# Patient Record
Sex: Female | Born: 1975 | Hispanic: No | Marital: Married | Smoking: Former smoker
Health system: Southern US, Community
[De-identification: ages and names within clinical notes are randomized; demographics above are authoritative.]

## PROBLEM LIST (undated history)

## (undated) ENCOUNTER — Inpatient Hospital Stay (HOSPITAL_COMMUNITY): Payer: Self-pay

## (undated) DIAGNOSIS — E669 Obesity, unspecified: Secondary | ICD-10-CM

## (undated) DIAGNOSIS — M722 Plantar fascial fibromatosis: Secondary | ICD-10-CM

## (undated) DIAGNOSIS — O24419 Gestational diabetes mellitus in pregnancy, unspecified control: Secondary | ICD-10-CM

## (undated) DIAGNOSIS — J45909 Unspecified asthma, uncomplicated: Secondary | ICD-10-CM

## (undated) DIAGNOSIS — I10 Essential (primary) hypertension: Secondary | ICD-10-CM

## (undated) DIAGNOSIS — E282 Polycystic ovarian syndrome: Secondary | ICD-10-CM

## (undated) DIAGNOSIS — D649 Anemia, unspecified: Secondary | ICD-10-CM

## (undated) DIAGNOSIS — E039 Hypothyroidism, unspecified: Secondary | ICD-10-CM

## (undated) DIAGNOSIS — E785 Hyperlipidemia, unspecified: Secondary | ICD-10-CM

## (undated) HISTORY — DX: Gestational diabetes mellitus in pregnancy, unspecified control: O24.419

## (undated) HISTORY — DX: Hyperlipidemia, unspecified: E78.5

## (undated) HISTORY — PX: OTHER SURGICAL HISTORY: SHX169

## (undated) HISTORY — DX: Obesity, unspecified: E66.9

## (undated) HISTORY — DX: Plantar fascial fibromatosis: M72.2

## (undated) HISTORY — PX: CERVICAL POLYPECTOMY: SHX88

## (undated) HISTORY — PX: PLANTAR FASCIA RELEASE: SHX2239

---

## 2008-02-20 ENCOUNTER — Inpatient Hospital Stay (HOSPITAL_COMMUNITY): Admission: AD | Admit: 2008-02-20 | Discharge: 2008-02-20 | Payer: Self-pay | Admitting: Obstetrics & Gynecology

## 2008-03-10 ENCOUNTER — Ambulatory Visit: Payer: Self-pay | Admitting: Internal Medicine

## 2008-03-10 ENCOUNTER — Encounter (INDEPENDENT_AMBULATORY_CARE_PROVIDER_SITE_OTHER): Payer: Self-pay | Admitting: Family Medicine

## 2008-03-10 LAB — CONVERTED CEMR LAB
ALT: 10 units/L (ref 0–35)
AST: 16 units/L (ref 0–37)
Albumin: 4.5 g/dL (ref 3.5–5.2)
Alkaline Phosphatase: 85 units/L (ref 39–117)
Anti Nuclear Antibody(ANA): NEGATIVE
BUN: 12 mg/dL (ref 6–23)
Basophils Absolute: 0 10*3/uL (ref 0.0–0.1)
Basophils Relative: 0 % (ref 0–1)
CO2: 24 meq/L (ref 19–32)
Calcium: 9.3 mg/dL (ref 8.4–10.5)
Chloride: 104 meq/L (ref 96–112)
Cholesterol: 212 mg/dL — ABNORMAL HIGH (ref 0–200)
Creatinine, Ser: 0.6 mg/dL (ref 0.40–1.20)
Eosinophils Absolute: 0.2 10*3/uL (ref 0.0–0.7)
Eosinophils Relative: 3 % (ref 0–5)
FSH: 3.2 milliintl units/mL
Glucose, Bld: 92 mg/dL (ref 70–99)
HCT: 41.8 % (ref 36.0–46.0)
HDL: 44 mg/dL (ref >39)
Hemoglobin: 13.7 g/dL (ref 12.0–15.0)
LDL Cholesterol: 134 mg/dL — ABNORMAL HIGH (ref 0–99)
Lymphocytes Relative: 35 % (ref 12–46)
Lymphs Abs: 2.9 10*3/uL (ref 0.7–4.0)
MCHC: 32.8 g/dL (ref 30.0–36.0)
MCV: 78.6 fL (ref 78.0–100.0)
Monocytes Absolute: 0.5 10*3/uL (ref 0.1–1.0)
Monocytes Relative: 6 % (ref 3–12)
Neutro Abs: 4.5 10*3/uL (ref 1.7–7.7)
Neutrophils Relative %: 55 % (ref 43–77)
Platelets: 254 10*3/uL (ref 150–400)
Potassium: 4.1 meq/L (ref 3.5–5.3)
RBC: 5.32 M/uL — ABNORMAL HIGH (ref 3.87–5.11)
RDW: 12.7 % (ref 11.5–15.5)
Sed Rate: 14 mm/hr (ref 0–22)
Sodium: 139 meq/L (ref 135–145)
TSH: 2.72 microintl units/mL (ref 0.350–5.50)
Testosterone: 59.05 ng/dL (ref 10–70)
Total Bilirubin: 0.5 mg/dL (ref 0.3–1.2)
Total CHOL/HDL Ratio: 4.8
Total Protein: 7.9 g/dL (ref 6.0–8.3)
Triglycerides: 170 mg/dL — ABNORMAL HIGH (ref <150)
VLDL: 34 mg/dL (ref 0–40)
WBC: 8.2 10*3/uL (ref 4.0–10.5)

## 2008-03-24 ENCOUNTER — Ambulatory Visit: Payer: Self-pay | Admitting: *Deleted

## 2008-03-24 ENCOUNTER — Ambulatory Visit: Payer: Self-pay | Admitting: Family Medicine

## 2008-03-25 ENCOUNTER — Encounter (INDEPENDENT_AMBULATORY_CARE_PROVIDER_SITE_OTHER): Payer: Self-pay | Admitting: Family Medicine

## 2008-04-07 ENCOUNTER — Ambulatory Visit: Payer: Self-pay | Admitting: Internal Medicine

## 2008-04-07 ENCOUNTER — Encounter (INDEPENDENT_AMBULATORY_CARE_PROVIDER_SITE_OTHER): Payer: Self-pay | Admitting: Family Medicine

## 2008-04-07 LAB — CONVERTED CEMR LAB
Basophils Absolute: 0 10*3/uL (ref 0.0–0.1)
Basophils Relative: 0 % (ref 0–1)
Eosinophils Absolute: 0.3 10*3/uL (ref 0.0–0.7)
Eosinophils Relative: 4 % (ref 0–5)
HCT: 41.2 % (ref 36.0–46.0)
Hemoglobin: 13.2 g/dL (ref 12.0–15.0)
Lymphocytes Relative: 34 % (ref 12–46)
Lymphs Abs: 2.3 10*3/uL (ref 0.7–4.0)
MCHC: 32 g/dL (ref 30.0–36.0)
MCV: 79.8 fL (ref 78.0–100.0)
Monocytes Absolute: 0.5 10*3/uL (ref 0.1–1.0)
Monocytes Relative: 7 % (ref 3–12)
Neutro Abs: 3.6 10*3/uL (ref 1.7–7.7)
Neutrophils Relative %: 54 % (ref 43–77)
Platelets: 266 10*3/uL (ref 150–400)
RBC: 5.16 M/uL — ABNORMAL HIGH (ref 3.87–5.11)
RDW: 13 % (ref 11.5–15.5)
WBC: 6.7 10*3/uL (ref 4.0–10.5)

## 2008-04-18 ENCOUNTER — Ambulatory Visit: Payer: Self-pay | Admitting: Internal Medicine

## 2008-04-22 ENCOUNTER — Ambulatory Visit (HOSPITAL_COMMUNITY): Admission: RE | Admit: 2008-04-22 | Discharge: 2008-04-22 | Payer: Self-pay | Admitting: Internal Medicine

## 2008-04-30 ENCOUNTER — Ambulatory Visit: Payer: Self-pay | Admitting: Internal Medicine

## 2008-05-20 ENCOUNTER — Ambulatory Visit: Payer: Self-pay | Admitting: Internal Medicine

## 2008-05-20 ENCOUNTER — Encounter (INDEPENDENT_AMBULATORY_CARE_PROVIDER_SITE_OTHER): Payer: Self-pay | Admitting: Family Medicine

## 2008-05-20 LAB — CONVERTED CEMR LAB
ALT: 11 units/L (ref 0–35)
AST: 14 units/L (ref 0–37)
Albumin: 4.1 g/dL (ref 3.5–5.2)
Alkaline Phosphatase: 83 units/L (ref 39–117)
BUN: 16 mg/dL (ref 6–23)
CO2: 25 meq/L (ref 19–32)
Calcium: 8.9 mg/dL (ref 8.4–10.5)
Chloride: 105 meq/L (ref 96–112)
Cholesterol: 201 mg/dL — ABNORMAL HIGH (ref 0–200)
Creatinine, Ser: 0.63 mg/dL (ref 0.40–1.20)
Glucose, Bld: 147 mg/dL — ABNORMAL HIGH (ref 70–99)
HDL: 39 mg/dL — ABNORMAL LOW (ref >39)
LDL Cholesterol: 134 mg/dL — ABNORMAL HIGH (ref 0–99)
Potassium: 4.6 meq/L (ref 3.5–5.3)
Sodium: 140 meq/L (ref 135–145)
TSH: 5.113 microintl units/mL — ABNORMAL HIGH (ref 0.350–4.50)
Total Bilirubin: 0.4 mg/dL (ref 0.3–1.2)
Total CHOL/HDL Ratio: 5.2
Total Protein: 7.1 g/dL (ref 6.0–8.3)
Triglycerides: 138 mg/dL (ref <150)
VLDL: 28 mg/dL (ref 0–40)

## 2008-05-21 ENCOUNTER — Encounter (INDEPENDENT_AMBULATORY_CARE_PROVIDER_SITE_OTHER): Payer: Self-pay | Admitting: Family Medicine

## 2008-06-04 ENCOUNTER — Ambulatory Visit: Payer: Self-pay | Admitting: Family Medicine

## 2008-06-04 LAB — CONVERTED CEMR LAB
T3, Total: 164.8 ng/dL (ref 80.0–204.0)
T4, Total: 9.2 ug/dL (ref 5.0–12.5)
TSH: 5.753 microintl units/mL — ABNORMAL HIGH (ref 0.350–4.50)

## 2008-06-09 ENCOUNTER — Ambulatory Visit: Payer: Self-pay | Admitting: Internal Medicine

## 2008-06-10 ENCOUNTER — Encounter (INDEPENDENT_AMBULATORY_CARE_PROVIDER_SITE_OTHER): Payer: Self-pay | Admitting: Family Medicine

## 2008-06-17 ENCOUNTER — Ambulatory Visit: Payer: Self-pay | Admitting: Internal Medicine

## 2008-07-03 ENCOUNTER — Ambulatory Visit: Payer: Self-pay | Admitting: Internal Medicine

## 2008-07-15 ENCOUNTER — Ambulatory Visit: Payer: Self-pay | Admitting: Internal Medicine

## 2008-07-15 ENCOUNTER — Encounter (INDEPENDENT_AMBULATORY_CARE_PROVIDER_SITE_OTHER): Payer: Self-pay | Admitting: Adult Health

## 2008-07-15 LAB — CONVERTED CEMR LAB
Basophils Absolute: 0 10*3/uL (ref 0.0–0.1)
Basophils Relative: 0 % (ref 0–1)
Eosinophils Absolute: 0.2 10*3/uL (ref 0.0–0.7)
Eosinophils Relative: 3 % (ref 0–5)
HCT: 37.1 % (ref 36.0–46.0)
Hemoglobin: 12.5 g/dL (ref 12.0–15.0)
Lymphocytes Relative: 28 % (ref 12–46)
Lymphs Abs: 2.4 10*3/uL (ref 0.7–4.0)
MCHC: 33.7 g/dL (ref 30.0–36.0)
MCV: 78.4 fL (ref 78.0–100.0)
Monocytes Absolute: 0.6 10*3/uL (ref 0.1–1.0)
Monocytes Relative: 7 % (ref 3–12)
Neutro Abs: 5.5 10*3/uL (ref 1.7–7.7)
Neutrophils Relative %: 63 % (ref 43–77)
Platelets: 245 10*3/uL (ref 150–400)
RBC: 4.73 M/uL (ref 3.87–5.11)
RDW: 12.5 % (ref 11.5–15.5)
T4, Total: 9.3 ug/dL (ref 5.0–12.5)
TSH: 2.77 microintl units/mL (ref 0.350–4.50)
WBC: 8.7 10*3/uL (ref 4.0–10.5)

## 2008-07-18 ENCOUNTER — Ambulatory Visit: Payer: Self-pay | Admitting: Internal Medicine

## 2008-07-18 ENCOUNTER — Encounter (INDEPENDENT_AMBULATORY_CARE_PROVIDER_SITE_OTHER): Payer: Self-pay | Admitting: Adult Health

## 2008-07-18 LAB — CONVERTED CEMR LAB
ALT: 13 units/L (ref 0–35)
AST: 15 units/L (ref 0–37)
Albumin: 4.4 g/dL (ref 3.5–5.2)
Alkaline Phosphatase: 94 units/L (ref 39–117)
BUN: 14 mg/dL (ref 6–23)
CEA: 0.9 ng/mL (ref 0.0–5.0)
CO2: 27 meq/L (ref 19–32)
Calcium: 9.1 mg/dL (ref 8.4–10.5)
Chloride: 102 meq/L (ref 96–112)
Cholesterol: 202 mg/dL — ABNORMAL HIGH (ref 0–200)
Creatinine, Ser: 0.64 mg/dL (ref 0.40–1.20)
Glucose, Bld: 103 mg/dL — ABNORMAL HIGH (ref 70–99)
HDL: 40 mg/dL (ref >39)
LDL Cholesterol: 129 mg/dL — ABNORMAL HIGH (ref 0–99)
Microalb, Ur: 0.55 mg/dL (ref 0.00–1.89)
Potassium: 4.3 meq/L (ref 3.5–5.3)
Sodium: 141 meq/L (ref 135–145)
Total Bilirubin: 0.4 mg/dL (ref 0.3–1.2)
Total CHOL/HDL Ratio: 5.1
Total Protein: 7.5 g/dL (ref 6.0–8.3)
Triglycerides: 164 mg/dL — ABNORMAL HIGH (ref <150)
VLDL: 33 mg/dL (ref 0–40)
Vit D, 1,25-Dihydroxy: 16 — ABNORMAL LOW (ref 30–89)

## 2008-07-19 ENCOUNTER — Encounter (INDEPENDENT_AMBULATORY_CARE_PROVIDER_SITE_OTHER): Payer: Self-pay | Admitting: Adult Health

## 2008-09-16 ENCOUNTER — Ambulatory Visit: Payer: Self-pay | Admitting: Internal Medicine

## 2008-09-29 ENCOUNTER — Ambulatory Visit: Payer: Self-pay | Admitting: Internal Medicine

## 2008-09-29 ENCOUNTER — Encounter (INDEPENDENT_AMBULATORY_CARE_PROVIDER_SITE_OTHER): Payer: Self-pay | Admitting: Adult Health

## 2008-09-29 LAB — CONVERTED CEMR LAB
ALT: 12 units/L (ref 0–35)
AST: 12 units/L (ref 0–37)
Albumin: 4.4 g/dL (ref 3.5–5.2)
Alkaline Phosphatase: 78 units/L (ref 39–117)
BUN: 14 mg/dL (ref 6–23)
Basophils Absolute: 0 10*3/uL (ref 0.0–0.1)
Basophils Relative: 0 % (ref 0–1)
CO2: 20 meq/L (ref 19–32)
Calcium: 8.7 mg/dL (ref 8.4–10.5)
Chloride: 104 meq/L (ref 96–112)
Cholesterol: 199 mg/dL (ref 0–200)
Creatinine, Ser: 0.59 mg/dL (ref 0.40–1.20)
Eosinophils Absolute: 0.1 10*3/uL (ref 0.0–0.7)
Eosinophils Relative: 1 % (ref 0–5)
Free T4: 0.9 ng/dL (ref 0.89–1.80)
Glucose, Bld: 83 mg/dL (ref 70–99)
HCT: 38.8 % (ref 36.0–46.0)
HDL: 43 mg/dL (ref >39)
Hemoglobin: 13 g/dL (ref 12.0–15.0)
LDL Cholesterol: 135 mg/dL — ABNORMAL HIGH (ref 0–99)
Lymphocytes Relative: 28 % (ref 12–46)
Lymphs Abs: 2.6 10*3/uL (ref 0.7–4.0)
MCHC: 33.5 g/dL (ref 30.0–36.0)
MCV: 75.8 fL — ABNORMAL LOW (ref 78.0–100.0)
Monocytes Absolute: 0.5 10*3/uL (ref 0.1–1.0)
Monocytes Relative: 5 % (ref 3–12)
Neutro Abs: 6.1 10*3/uL (ref 1.7–7.7)
Neutrophils Relative %: 66 % (ref 43–77)
Platelets: 231 10*3/uL (ref 150–400)
Potassium: 3.7 meq/L (ref 3.5–5.3)
RBC: 5.12 M/uL — ABNORMAL HIGH (ref 3.87–5.11)
RDW: 12.5 % (ref 11.5–15.5)
Sodium: 140 meq/L (ref 135–145)
T3 Uptake Ratio: 29.8 % (ref 22.5–37.0)
T4, Total: 8.8 ug/dL (ref 5.0–12.5)
TSH: 2.579 microintl units/mL (ref 0.350–4.50)
Total Bilirubin: 0.5 mg/dL (ref 0.3–1.2)
Total CHOL/HDL Ratio: 4.6
Total Protein: 7.5 g/dL (ref 6.0–8.3)
Triglycerides: 107 mg/dL (ref <150)
VLDL: 21 mg/dL (ref 0–40)
WBC: 9.2 10*3/uL (ref 4.0–10.5)

## 2008-12-29 ENCOUNTER — Ambulatory Visit: Payer: Self-pay | Admitting: Internal Medicine

## 2008-12-29 ENCOUNTER — Encounter (INDEPENDENT_AMBULATORY_CARE_PROVIDER_SITE_OTHER): Payer: Self-pay | Admitting: Adult Health

## 2008-12-29 LAB — CONVERTED CEMR LAB
ALT: 12 units/L (ref 0–35)
AST: 14 units/L (ref 0–37)
Albumin: 4.4 g/dL (ref 3.5–5.2)
Alkaline Phosphatase: 82 units/L (ref 39–117)
BUN: 9 mg/dL (ref 6–23)
Basophils Absolute: 0 10*3/uL (ref 0.0–0.1)
Basophils Relative: 0 % (ref 0–1)
CO2: 24 meq/L (ref 19–32)
Calcium: 9.1 mg/dL (ref 8.4–10.5)
Chloride: 104 meq/L (ref 96–112)
Cholesterol: 217 mg/dL — ABNORMAL HIGH (ref 0–200)
Creatinine, Ser: 0.65 mg/dL (ref 0.40–1.20)
Eosinophils Absolute: 0.4 10*3/uL (ref 0.0–0.7)
Eosinophils Relative: 6 % — ABNORMAL HIGH (ref 0–5)
Free T4: 0.87 ng/dL — ABNORMAL LOW (ref 0.89–1.80)
Glucose, Bld: 96 mg/dL (ref 70–99)
HCT: 39.9 % (ref 36.0–46.0)
HDL: 36 mg/dL — ABNORMAL LOW (ref >39)
Hemoglobin: 13.4 g/dL (ref 12.0–15.0)
LDL Cholesterol: 158 mg/dL — ABNORMAL HIGH (ref 0–99)
Lymphocytes Relative: 31 % (ref 12–46)
Lymphs Abs: 1.9 10*3/uL (ref 0.7–4.0)
MCHC: 33.6 g/dL (ref 30.0–36.0)
MCV: 78.7 fL (ref 78.0–100.0)
Monocytes Absolute: 0.5 10*3/uL (ref 0.1–1.0)
Monocytes Relative: 9 % (ref 3–12)
Neutro Abs: 3.3 10*3/uL (ref 1.7–7.7)
Neutrophils Relative %: 54 % (ref 43–77)
Platelets: 257 10*3/uL (ref 150–400)
Potassium: 4.2 meq/L (ref 3.5–5.3)
RBC: 5.07 M/uL (ref 3.87–5.11)
RDW: 13.2 % (ref 11.5–15.5)
Sodium: 141 meq/L (ref 135–145)
TSH: 3.229 microintl units/mL (ref 0.350–4.500)
Total Bilirubin: 0.7 mg/dL (ref 0.3–1.2)
Total CHOL/HDL Ratio: 6
Total Protein: 7.3 g/dL (ref 6.0–8.3)
Triglycerides: 116 mg/dL (ref <150)
VLDL: 23 mg/dL (ref 0–40)
WBC: 6.2 10*3/uL (ref 4.0–10.5)

## 2009-01-15 ENCOUNTER — Ambulatory Visit: Payer: Self-pay | Admitting: Internal Medicine

## 2009-02-03 ENCOUNTER — Emergency Department (HOSPITAL_COMMUNITY): Admission: EM | Admit: 2009-02-03 | Discharge: 2009-02-03 | Payer: Self-pay | Admitting: Emergency Medicine

## 2009-03-12 ENCOUNTER — Ambulatory Visit: Payer: Self-pay | Admitting: Family Medicine

## 2009-03-19 ENCOUNTER — Encounter: Admission: RE | Admit: 2009-03-19 | Discharge: 2009-03-19 | Payer: Self-pay | Admitting: Adult Health

## 2009-06-25 ENCOUNTER — Ambulatory Visit: Payer: Self-pay | Admitting: Family Medicine

## 2009-06-25 ENCOUNTER — Encounter (INDEPENDENT_AMBULATORY_CARE_PROVIDER_SITE_OTHER): Payer: Self-pay | Admitting: Adult Health

## 2009-06-25 LAB — CONVERTED CEMR LAB
Basophils Absolute: 0 10*3/uL (ref 0.0–0.1)
Basophils Relative: 0 % (ref 0–1)
Chlamydia, DNA Probe: NEGATIVE
Eosinophils Absolute: 0.3 10*3/uL (ref 0.0–0.7)
Eosinophils Relative: 3 % (ref 0–5)
FSH: 5.1 milliintl units/mL
GC Probe Amp, Genital: NEGATIVE
HCT: 38.8 % (ref 36.0–46.0)
Helicobacter Pylori Antibody-IgG: 4.3 — ABNORMAL HIGH
Hemoglobin: 12.7 g/dL (ref 12.0–15.0)
LH: 26.4 milliintl units/mL
Lymphocytes Relative: 29 % (ref 12–46)
Lymphs Abs: 2.8 10*3/uL (ref 0.7–4.0)
MCHC: 32.7 g/dL (ref 30.0–36.0)
MCV: 77.9 fL — ABNORMAL LOW (ref 78.0–100.0)
Monocytes Absolute: 0.6 10*3/uL (ref 0.1–1.0)
Monocytes Relative: 6 % (ref 3–12)
Neutro Abs: 6.1 10*3/uL (ref 1.7–7.7)
Neutrophils Relative %: 62 % (ref 43–77)
Platelets: 254 10*3/uL (ref 150–400)
RBC: 4.98 M/uL (ref 3.87–5.11)
RDW: 12.6 % (ref 11.5–15.5)
T3 Uptake Ratio: 34.1 % (ref 22.5–37.0)
TSH: 3.426 microintl units/mL (ref 0.350–4.500)
Testosterone: 90.82 ng/dL — ABNORMAL HIGH (ref 10–70)
WBC: 9.9 10*3/uL (ref 4.0–10.5)

## 2009-06-26 ENCOUNTER — Ambulatory Visit: Payer: Self-pay | Admitting: Internal Medicine

## 2009-06-29 ENCOUNTER — Encounter (INDEPENDENT_AMBULATORY_CARE_PROVIDER_SITE_OTHER): Payer: Self-pay | Admitting: Adult Health

## 2009-06-29 LAB — CONVERTED CEMR LAB: Hgb A1c MFr Bld: 5.7 % (ref 4.6–6.1)

## 2009-07-08 ENCOUNTER — Ambulatory Visit (HOSPITAL_COMMUNITY): Admission: RE | Admit: 2009-07-08 | Discharge: 2009-07-08 | Payer: Self-pay | Admitting: Internal Medicine

## 2009-07-23 ENCOUNTER — Ambulatory Visit: Payer: Self-pay | Admitting: Internal Medicine

## 2009-09-11 ENCOUNTER — Ambulatory Visit: Payer: Self-pay | Admitting: Internal Medicine

## 2009-09-11 ENCOUNTER — Encounter (INDEPENDENT_AMBULATORY_CARE_PROVIDER_SITE_OTHER): Payer: Self-pay | Admitting: Adult Health

## 2009-09-11 LAB — CONVERTED CEMR LAB
T3 Uptake Ratio: 30.4 % (ref 22.5–37.0)
TSH: 2.374 microintl units/mL (ref 0.350–4.500)
Vit D, 25-Hydroxy: 17 ng/mL — ABNORMAL LOW (ref 30–89)

## 2010-02-12 ENCOUNTER — Ambulatory Visit: Payer: Self-pay | Admitting: Family Medicine

## 2010-02-12 ENCOUNTER — Encounter (INDEPENDENT_AMBULATORY_CARE_PROVIDER_SITE_OTHER): Payer: Self-pay | Admitting: Adult Health

## 2010-02-12 LAB — CONVERTED CEMR LAB
ALT: 9 units/L (ref 0–35)
AST: 14 units/L (ref 0–37)
Albumin: 4.7 g/dL (ref 3.5–5.2)
Alkaline Phosphatase: 90 units/L (ref 39–117)
BUN: 16 mg/dL (ref 6–23)
Basophils Absolute: 0 10*3/uL (ref 0.0–0.1)
Basophils Relative: 0 % (ref 0–1)
CO2: 26 meq/L (ref 19–32)
Calcium: 9.3 mg/dL (ref 8.4–10.5)
Chloride: 103 meq/L (ref 96–112)
Cholesterol: 229 mg/dL — ABNORMAL HIGH (ref 0–200)
Creatinine, Ser: 0.74 mg/dL (ref 0.40–1.20)
Eosinophils Absolute: 0.4 10*3/uL (ref 0.0–0.7)
Eosinophils Relative: 5 % (ref 0–5)
Free T4: 1.05 ng/dL (ref 0.80–1.80)
Glucose, Bld: 101 mg/dL — ABNORMAL HIGH (ref 70–99)
HCT: 42.3 % (ref 36.0–46.0)
HDL: 42 mg/dL (ref >39)
Hemoglobin: 13.8 g/dL (ref 12.0–15.0)
LDL Cholesterol: 161 mg/dL — ABNORMAL HIGH (ref 0–99)
Lymphocytes Relative: 29 % (ref 12–46)
Lymphs Abs: 2.1 10*3/uL (ref 0.7–4.0)
MCHC: 32.6 g/dL (ref 30.0–36.0)
MCV: 78.6 fL (ref 78.0–100.0)
Monocytes Absolute: 0.5 10*3/uL (ref 0.1–1.0)
Monocytes Relative: 6 % (ref 3–12)
Neutro Abs: 4.2 10*3/uL (ref 1.7–7.7)
Neutrophils Relative %: 59 % (ref 43–77)
Platelets: 251 10*3/uL (ref 150–400)
Potassium: 4.1 meq/L (ref 3.5–5.3)
RBC: 5.38 M/uL — ABNORMAL HIGH (ref 3.87–5.11)
RDW: 13.1 % (ref 11.5–15.5)
Sed Rate: 16 mm/hr (ref 0–22)
Sodium: 140 meq/L (ref 135–145)
TSH: 5.225 microintl units/mL — ABNORMAL HIGH (ref 0.350–4.500)
Total Bilirubin: 0.4 mg/dL (ref 0.3–1.2)
Total CHOL/HDL Ratio: 5.5
Total Protein: 7.7 g/dL (ref 6.0–8.3)
Triglycerides: 130 mg/dL (ref <150)
Uric Acid, Serum: 5.9 mg/dL (ref 2.4–7.0)
VLDL: 26 mg/dL (ref 0–40)
Vit D, 25-Hydroxy: 24 ng/mL — ABNORMAL LOW (ref 30–89)
WBC: 7.1 10*3/uL (ref 4.0–10.5)

## 2010-02-26 ENCOUNTER — Ambulatory Visit: Payer: Self-pay | Admitting: Internal Medicine

## 2010-03-09 ENCOUNTER — Ambulatory Visit: Payer: Self-pay | Admitting: Internal Medicine

## 2010-03-16 ENCOUNTER — Ambulatory Visit: Payer: Self-pay | Admitting: Internal Medicine

## 2010-04-14 ENCOUNTER — Ambulatory Visit: Payer: Self-pay | Admitting: Internal Medicine

## 2010-04-14 ENCOUNTER — Ambulatory Visit (HOSPITAL_COMMUNITY): Admission: RE | Admit: 2010-04-14 | Discharge: 2010-04-14 | Payer: Self-pay | Admitting: Podiatry

## 2010-04-30 ENCOUNTER — Ambulatory Visit: Payer: Self-pay | Admitting: Internal Medicine

## 2010-04-30 LAB — CONVERTED CEMR LAB
ALT: 12 units/L (ref 0–35)
AST: 13 units/L (ref 0–37)
Albumin: 4.4 g/dL (ref 3.5–5.2)
Alkaline Phosphatase: 75 units/L (ref 39–117)
Anti Nuclear Antibody(ANA): NEGATIVE
BUN: 12 mg/dL (ref 6–23)
Basophils Absolute: 0 10*3/uL (ref 0.0–0.1)
Basophils Relative: 0 % (ref 0–1)
CO2: 24 meq/L (ref 19–32)
CRP: 2 mg/dL — ABNORMAL HIGH (ref <0.6)
Calcium: 8.8 mg/dL (ref 8.4–10.5)
Chloride: 105 meq/L (ref 96–112)
Creatinine, Ser: 0.66 mg/dL (ref 0.40–1.20)
Eosinophils Absolute: 0.3 10*3/uL (ref 0.0–0.7)
Eosinophils Relative: 4 % (ref 0–5)
Glucose, Bld: 96 mg/dL (ref 70–99)
HCT: 38.7 % (ref 36.0–46.0)
Hemoglobin: 12.8 g/dL (ref 12.0–15.0)
Lymphocytes Relative: 30 % (ref 12–46)
Lymphs Abs: 2.2 10*3/uL (ref 0.7–4.0)
MCHC: 33.1 g/dL (ref 30.0–36.0)
MCV: 76.9 fL — ABNORMAL LOW (ref 78.0–100.0)
Monocytes Absolute: 0.4 10*3/uL (ref 0.1–1.0)
Monocytes Relative: 5 % (ref 3–12)
Neutro Abs: 4.5 10*3/uL (ref 1.7–7.7)
Neutrophils Relative %: 61 % (ref 43–77)
Platelets: 242 10*3/uL (ref 150–400)
Potassium: 4 meq/L (ref 3.5–5.3)
RBC: 5.03 M/uL (ref 3.87–5.11)
RDW: 12.9 % (ref 11.5–15.5)
Rheumatoid fact SerPl-aCnc: <20 intl units/mL (ref 0–20)
Sed Rate: 13 mm/hr (ref 0–22)
Sodium: 138 meq/L (ref 135–145)
T4, Total: 10.6 ug/dL (ref 5.0–12.5)
TSH: 2.922 microintl units/mL (ref 0.350–4.500)
Total Bilirubin: 0.5 mg/dL (ref 0.3–1.2)
Total Protein: 7.1 g/dL (ref 6.0–8.3)
Uric Acid, Serum: 5.1 mg/dL (ref 2.4–7.0)
WBC: 7.4 10*3/uL (ref 4.0–10.5)

## 2010-05-17 ENCOUNTER — Telehealth (INDEPENDENT_AMBULATORY_CARE_PROVIDER_SITE_OTHER): Payer: Self-pay | Admitting: *Deleted

## 2010-05-21 ENCOUNTER — Ambulatory Visit: Payer: Self-pay | Admitting: Family Medicine

## 2010-07-08 ENCOUNTER — Ambulatory Visit: Payer: Self-pay | Admitting: Obstetrics & Gynecology

## 2010-07-08 LAB — CONVERTED CEMR LAB
Chlamydia, DNA Probe: NEGATIVE
GC Probe Amp, Genital: NEGATIVE
Prolactin: 18.1 ng/mL
TSH: 3.051 microintl units/mL (ref 0.350–4.500)
Testosterone: 40.27 ng/dL (ref 10–70)
hCG, Beta Chain, Quant, S: <2 milliintl units/mL

## 2010-07-14 ENCOUNTER — Ambulatory Visit (HOSPITAL_COMMUNITY): Admission: RE | Admit: 2010-07-14 | Discharge: 2010-07-14 | Payer: Self-pay | Admitting: Family Medicine

## 2010-07-29 ENCOUNTER — Ambulatory Visit: Payer: Self-pay | Admitting: Obstetrics & Gynecology

## 2010-07-29 ENCOUNTER — Encounter (INDEPENDENT_AMBULATORY_CARE_PROVIDER_SITE_OTHER): Payer: Self-pay | Admitting: *Deleted

## 2010-07-29 LAB — CONVERTED CEMR LAB: Hgb A1c MFr Bld: 5.4 % (ref <5.7)

## 2010-10-19 ENCOUNTER — Encounter (INDEPENDENT_AMBULATORY_CARE_PROVIDER_SITE_OTHER): Payer: Self-pay | Admitting: Internal Medicine

## 2010-10-19 LAB — CONVERTED CEMR LAB
BUN: 12 mg/dL (ref 6–23)
CO2: 25 meq/L (ref 19–32)
Creatinine, Ser: 0.62 mg/dL (ref 0.40–1.20)
FSH: 3.7 milliintl units/mL
Glucose, Bld: 87 mg/dL (ref 70–99)
Helicobacter Pylori Antibody-IgG: 0.62
LH: 6.8 milliintl units/mL
TSH: 5.068 microintl units/mL — ABNORMAL HIGH (ref 0.350–4.500)
Total Bilirubin: 0.3 mg/dL (ref 0.3–1.2)

## 2010-10-26 NOTE — Progress Notes (Signed)
Summary: triage/vaginal bleeding  Phone Note Call from Patient   Caller: Patient Reason for Call: Talk to Nurse Summary of Call: Patient had wanted a referral for Gyn. She had been trying to get pregnant and was seeing Cy Blamer NP.  She had her regular period August 5th and is now bleeding again today.  She describes this bleeding  as light and is not having any cramping or pain. Advised patient that we do not have any appointments today and that she could possibly be pregnant or just having an irregular period.  Should her bleeding become heavy or experience cramping or pain she may want to have someone [facility].  Patient to monitor bleeding and call me if not changes later in the week . Patient to schedule appointment with new provider in our office for follow up care. Initial call taken by: Conchita Paris,  May 17, 2010 2:43 PM

## 2010-11-15 ENCOUNTER — Ambulatory Visit: Payer: Self-pay | Admitting: Physician Assistant

## 2010-11-18 ENCOUNTER — Ambulatory Visit: Payer: PRIVATE HEALTH INSURANCE | Admitting: Physician Assistant

## 2010-11-18 DIAGNOSIS — N979 Female infertility, unspecified: Secondary | ICD-10-CM

## 2010-11-26 ENCOUNTER — Ambulatory Visit: Payer: Self-pay | Admitting: Obstetrics & Gynecology

## 2010-12-09 LAB — POCT PREGNANCY, URINE: Preg Test, Ur: NEGATIVE

## 2010-12-14 ENCOUNTER — Encounter: Payer: Self-pay | Admitting: Internal Medicine

## 2010-12-17 NOTE — Progress Notes (Signed)
[name], [name]              ACCOUNT NO.:  [id number]  MEDICAL RECORD NO.:  [id number]           PATIENT TYPE:  A  LOCATION:  [facility]                   FACILITY:  [facility]  PHYSICIAN:  Maylon Cos, CNM    DATE OF BIRTH:  1976-03-18  DATE OF SERVICE:  11/18/2010                                 CLINIC NOTE  The patient is being seen in GYN Clinic, [facility].  REASON FOR TODAY'S VISIT:  Follow up infertility and PCOS.  HISTORY OF PRESENT ILLNESS:  The patient is a 35 year old [place] female who is nulligravida.  She has been treated for PCOS and primary infertility in our Infertility Clinic.  For the last 3 months, she has been taking Clomid 25 mg on days 5 five through 9 of her menstrual cycles and using ovulation predictor kits to assess ovulation.  The patient reports that in the last 3 months that she has not ovulated on her predictor kit.  She is also a patient of [facility], she did discuss this with them and they did run a lab work that in addition to her routine TSH screening did show that her midcycle FSH was low and that she is likely not surging.  Her TSH was elevated at 5.068, and they did adjust her levothyroxine at that time to 50 mcg p.o. daily with plans to follow up in their office in 6-8 weeks.  She returns today for infertility treatment.  I have discussed with this patient that again I agree that she is not having a midcycle surge as expected which is also being seen on the ovulation predictor kit, and I recommend that we indeed increase her dosage of Clomid to 50 mg with her next cycle.  She is instructed to use 50 mg of Clomid p.o. on days 5 through 9 and also use her ovulation predictor kit daily to assess for ovulation.  If she achieves ovulation at this level, then we will continue her on 50 mg of Clomid daily until she hopefully achieves pregnancy.  If after this month of increasing her dose to 50 mg, she does not achieve ovulation at the next  cycle, she should increase her dosage to 100 mg and do her ovulation predictor kit.  I will keep her on 100 mg on days 5 through 9 for two cycles, and she should return to me in approximately 3 months if she has not achieved pregnancy.  The patient has multiple questions about the process from here.  She has undergone lab work and semen analysis with Dr. Elesa Hacker.  Unfortunately, she was unable to complete full treatment with him secondary to cost.  She does note that his evaluation of her husband semen analysis did show some motility problems but otherwise, normal count.  She is not currently financially able to afford HSG for tube patency.  We have also discussed PCOS and hypothyroidism as a lot of possible contributing factors as well.  I have strongly encouraged the patient to restart her metformin; however, she states that she is intolerant of the metformin due to hypoglycemic events, and she declines to restart at this time.  Of note, we  did also discuss that the patient has gained approximately 20 pounds since her last visit in November, and she is strongly encouraged weight loss and again discussion of metformin could aid in this as well.  IMPRESSION: 1. Primary infertility. 2. Polycystic ovarian syndrome. 3. History of irregular periods.  PLAN: 1. As above increase Clomid dose to 50 mg x1 cycle, if no ovulation     increase to 100 mg for two cycles.  The patient should return in 3     months at the next Infertility Clinic for further evaluation and     possible medication increase dose. 2. The patient has also been given information per her request for     Reproductive Endocrinology.  She has been given a referral     information to [facility] and also     [facility]  The patient should follow up in clinic in approximately 3     months or p.r.n. problems.          ______________________________ Maylon Cos, CNM    SS/MEDQ  D:  11/18/2010  T:   11/19/2010  Job:  [id number]

## 2011-02-03 ENCOUNTER — Encounter: Payer: Self-pay | Attending: Family Medicine | Admitting: *Deleted

## 2011-02-03 DIAGNOSIS — E785 Hyperlipidemia, unspecified: Secondary | ICD-10-CM | POA: Insufficient documentation

## 2011-02-03 DIAGNOSIS — Z713 Dietary counseling and surveillance: Secondary | ICD-10-CM | POA: Insufficient documentation

## 2011-02-25 ENCOUNTER — Ambulatory Visit: Payer: PRIVATE HEALTH INSURANCE | Admitting: Obstetrics and Gynecology

## 2011-03-14 ENCOUNTER — Ambulatory Visit: Payer: PRIVATE HEALTH INSURANCE | Admitting: *Deleted

## 2011-04-27 HISTORY — PX: PLANTAR FASCIA SURGERY: SHX746

## 2011-05-28 HISTORY — PX: OTHER SURGICAL HISTORY: SHX169

## 2011-05-28 HISTORY — PX: PLANTAR FASCIA SURGERY: SHX746

## 2011-06-22 LAB — URINALYSIS, ROUTINE W REFLEX MICROSCOPIC
Bilirubin Urine: NEGATIVE
Glucose, UA: NEGATIVE
Ketones, ur: NEGATIVE
Leukocytes, UA: NEGATIVE
Nitrite: NEGATIVE
Protein, ur: NEGATIVE
Specific Gravity, Urine: 1.025
Urobilinogen, UA: 0.2
pH: 5

## 2011-06-22 LAB — POCT PREGNANCY, URINE
Operator id: 29278
Preg Test, Ur: NEGATIVE

## 2011-06-22 LAB — URINE MICROSCOPIC-ADD ON

## 2012-05-22 ENCOUNTER — Encounter (HOSPITAL_COMMUNITY): Payer: Self-pay | Admitting: *Deleted

## 2012-05-23 ENCOUNTER — Encounter (HOSPITAL_COMMUNITY): Payer: Self-pay | Admitting: Pharmacist

## 2012-05-25 ENCOUNTER — Encounter (HOSPITAL_COMMUNITY)
Admission: RE | Admit: 2012-05-25 | Discharge: 2012-05-25 | Disposition: A | Discharge status: Home / Self Care | Payer: 59 | Source: Ambulatory Visit | Attending: Obstetrics and Gynecology | Admitting: Obstetrics and Gynecology

## 2012-05-25 ENCOUNTER — Encounter (HOSPITAL_COMMUNITY): Payer: Self-pay

## 2012-05-25 HISTORY — DX: Polycystic ovarian syndrome: E28.2

## 2012-05-25 HISTORY — DX: Unspecified asthma, uncomplicated: J45.909

## 2012-05-25 HISTORY — DX: Hypothyroidism, unspecified: E03.9

## 2012-05-25 HISTORY — DX: Essential (primary) hypertension: I10

## 2012-05-25 LAB — CBC
MCH: 25.1 pg — ABNORMAL LOW (ref 26.0–34.0)
MCV: 77.4 fL — ABNORMAL LOW (ref 78.0–100.0)
Platelets: 249 10*3/uL (ref 150–400)
RDW: 12.7 % (ref 11.5–15.5)
WBC: 10.5 10*3/uL (ref 4.0–10.5)

## 2012-05-25 NOTE — Patient Instructions (Addendum)
        Your procedure is scheduled on:06/01/12  Enter through the Main Entrance at :0600 am Pick up desk phone and dial [phone] and inform us of your arrival.  Please call [phone] if you have any problems the morning of surgery.  Remember: Do not eat after midnight:Thursday Do not drink after:midnight Thursday  Take these meds the morning of surgery with a sip of water: thyroid pills HOLD METFORMIN FOR 24 HOURS PRIOR TO SURGERY  DO NOT wear jewelry, eye make-up, lipstick,body lotion, or dark fingernail polish. Do not shave for 48 hours prior to surgery.   Patients discharged on the day of surgery will not be allowed to drive home.   Remember to use your Hibiclens as instructed.

## 2012-05-30 NOTE — H&P (Signed)
NAMESEVILLA, MURTAGH              ACCOUNT NO.:  [id number]  MEDICAL RECORD NO.:  [id number]  LOCATION:  [facility]                         [facility]ILITY:  [facility]  PHYSICIAN:  Zelphia Cairo, MD    DATE OF BIRTH:  June 30, 1976  DATE OF ADMISSION:  04/11/2012 DATE OF DISCHARGE:  05/25/2012                             HISTORY & PHYSICAL   HISTORY OF PRESENT ILLNESS:  A 37 year old, G0 presents today for hysteroscopy D and C, and removal of endometrial polyp.  Endometrial polyp was diagnosed during infertility workup.  PAST MEDICAL HISTORY:  Hypothyroidism, arthritis and diabetes.  MEDICATIONS:  Armour Thyroid and Glumetza.  SURGICAL HISTORY:  Bilateral foot surgery (plantar fasciitis.)  ALLERGIES:  None.  SOCIAL HISTORY:  Negative for tobacco, alcohol, and drug use.  FAMILY HISTORY:  Significant for heart disease.  PHYSICAL EXAMINATION:  VITAL SIGNS:  She is afebrile with stable vital signs.  Weight is 253 pounds. GENERAL:  She is in no acute distress. HEART:  Regular rate and rhythm. LUNGS:  Clear bilaterally. ABDOMEN:  Soft, nontender, nondistended.  No rebound or guarding.  IMAGING:  Pelvic ultrasound shows a 12-mm density in the endometrial cavity.  Bilateral ovaries appear normal.  ASSESSMENT:  A 36 year old with endometrial polyp and infertility.  PLAN:  For hysteroscopy and resection of endometrial polyp.     Zelphia Cairo, MD     GA/MEDQ  D:  05/29/2012  T:  05/30/2012  Job:  [id number]

## 2012-05-31 MED ORDER — DEXTROSE 5 % IV SOLN
2.0000 g | INTRAVENOUS | Status: AC
  Administered 2012-06-01: 2 g via INTRAVENOUS
  Filled 2012-05-31: qty 2

## 2012-06-01 ENCOUNTER — Ambulatory Visit (HOSPITAL_COMMUNITY): Payer: 59 | Admitting: Anesthesiology

## 2012-06-01 ENCOUNTER — Encounter (HOSPITAL_COMMUNITY): Payer: Self-pay | Admitting: Anesthesiology

## 2012-06-01 ENCOUNTER — Ambulatory Visit (HOSPITAL_COMMUNITY)
Admission: RE | Admit: 2012-06-01 | Discharge: 2012-06-01 | Disposition: A | Discharge status: Home / Self Care | Payer: 59 | Source: Ambulatory Visit | Attending: Obstetrics and Gynecology | Admitting: Obstetrics and Gynecology

## 2012-06-01 ENCOUNTER — Encounter (HOSPITAL_COMMUNITY): Payer: Self-pay | Admitting: *Deleted

## 2012-06-01 ENCOUNTER — Encounter (HOSPITAL_COMMUNITY)
Admission: RE | Disposition: A | Discharge status: Home / Self Care | Payer: Self-pay | Source: Ambulatory Visit | Attending: Obstetrics and Gynecology

## 2012-06-01 DIAGNOSIS — E039 Hypothyroidism, unspecified: Secondary | ICD-10-CM | POA: Insufficient documentation

## 2012-06-01 DIAGNOSIS — N979 Female infertility, unspecified: Secondary | ICD-10-CM | POA: Insufficient documentation

## 2012-06-01 DIAGNOSIS — N840 Polyp of corpus uteri: Secondary | ICD-10-CM | POA: Insufficient documentation

## 2012-06-01 DIAGNOSIS — E119 Type 2 diabetes mellitus without complications: Secondary | ICD-10-CM | POA: Insufficient documentation

## 2012-06-01 HISTORY — PX: HYSTEROSCOPY W/D&C: SHX1775

## 2012-06-01 SURGERY — DILATATION AND CURETTAGE /HYSTEROSCOPY
Anesthesia: General | Site: Vagina | Wound class: Clean Contaminated

## 2012-06-01 MED ORDER — ONDANSETRON HCL 4 MG/2ML IJ SOLN: 4.0000 mg | Freq: Once | INTRAMUSCULAR | Status: DC | PRN

## 2012-06-01 MED ORDER — PROPOFOL 10 MG/ML IV EMUL
INTRAVENOUS | Status: DC | PRN
  Administered 2012-06-01: 200 mg via INTRAVENOUS
  Administered 2012-06-01: 80 mg via INTRAVENOUS

## 2012-06-01 MED ORDER — ONDANSETRON HCL 4 MG/2ML IJ SOLN
INTRAMUSCULAR | Status: DC | PRN
  Administered 2012-06-01: 4 mg via INTRAVENOUS

## 2012-06-01 MED ORDER — ONDANSETRON HCL 4 MG/2ML IJ SOLN
INTRAMUSCULAR | Status: AC
  Filled 2012-06-01: qty 2

## 2012-06-01 MED ORDER — KETOROLAC TROMETHAMINE 30 MG/ML IJ SOLN
INTRAMUSCULAR | Status: DC | PRN
  Administered 2012-06-01: 30 mg via INTRAVENOUS

## 2012-06-01 MED ORDER — DEXAMETHASONE SODIUM PHOSPHATE 4 MG/ML IJ SOLN
INTRAMUSCULAR | Status: DC | PRN
  Administered 2012-06-01: 10 mg via INTRAVENOUS

## 2012-06-01 MED ORDER — KETOROLAC TROMETHAMINE 30 MG/ML IJ SOLN: 15.0000 mg | Freq: Once | INTRAMUSCULAR | Status: DC | PRN

## 2012-06-01 MED ORDER — KETOROLAC TROMETHAMINE 60 MG/2ML IM SOLN
INTRAMUSCULAR | Status: AC
  Filled 2012-06-01: qty 2

## 2012-06-01 MED ORDER — KETOROLAC TROMETHAMINE 60 MG/2ML IM SOLN
INTRAMUSCULAR | Status: DC | PRN
  Administered 2012-06-01: 30 mg via INTRAMUSCULAR

## 2012-06-01 MED ORDER — IBUPROFEN 200 MG PO TABS: 600.0000 mg | ORAL_TABLET | Freq: Four times a day (QID) | ORAL | Status: AC | PRN

## 2012-06-01 MED ORDER — MEPERIDINE HCL 25 MG/ML IJ SOLN: 6.2500 mg | INTRAMUSCULAR | Status: DC | PRN

## 2012-06-01 MED ORDER — PROPOFOL 10 MG/ML IV EMUL
INTRAVENOUS | Status: AC
  Filled 2012-06-01: qty 40

## 2012-06-01 MED ORDER — MIDAZOLAM HCL 5 MG/5ML IJ SOLN
INTRAMUSCULAR | Status: DC | PRN
  Administered 2012-06-01: 2 mg via INTRAVENOUS

## 2012-06-01 MED ORDER — FENTANYL CITRATE 0.05 MG/ML IJ SOLN: 25.0000 ug | INTRAMUSCULAR | Status: DC | PRN

## 2012-06-01 MED ORDER — DEXTROSE 5 % IV SOLN: 2.0000 g | INTRAVENOUS | Status: DC

## 2012-06-01 MED ORDER — LIDOCAINE HCL (CARDIAC) 20 MG/ML IV SOLN
INTRAVENOUS | Status: DC | PRN
  Administered 2012-06-01: 60 mg via INTRAVENOUS

## 2012-06-01 MED ORDER — LACTATED RINGERS IV SOLN
INTRAVENOUS | Status: DC
  Administered 2012-06-01: 07:00:00 via INTRAVENOUS

## 2012-06-01 MED ORDER — LIDOCAINE HCL 1 % IJ SOLN
INTRAMUSCULAR | Status: DC | PRN
  Administered 2012-06-01: 10 mL

## 2012-06-01 MED ORDER — DEXAMETHASONE SODIUM PHOSPHATE 10 MG/ML IJ SOLN
INTRAMUSCULAR | Status: AC
  Filled 2012-06-01: qty 1

## 2012-06-01 MED ORDER — FENTANYL CITRATE 0.05 MG/ML IJ SOLN
INTRAMUSCULAR | Status: DC | PRN
  Administered 2012-06-01 (×2): 50 ug via INTRAVENOUS

## 2012-06-01 MED ORDER — GLYCOPYRROLATE 0.2 MG/ML IJ SOLN
INTRAMUSCULAR | Status: AC
  Filled 2012-06-01: qty 1

## 2012-06-01 MED ORDER — MIDAZOLAM HCL 2 MG/2ML IJ SOLN
INTRAMUSCULAR | Status: AC
  Filled 2012-06-01: qty 2

## 2012-06-01 MED ORDER — FENTANYL CITRATE 0.05 MG/ML IJ SOLN
INTRAMUSCULAR | Status: AC
  Filled 2012-06-01: qty 2

## 2012-06-01 MED ORDER — SODIUM CHLORIDE 0.9 % IR SOLN
Status: DC | PRN
  Administered 2012-06-01: 3000 mL

## 2012-06-01 SURGICAL SUPPLY — 23 items
BLADE INCISOR TRUC PLUS 2.9 (ABLATOR) ×2 IMPLANT
CANISTER SUCTION 2500CC (MISCELLANEOUS) ×3 IMPLANT
CATH ROBINSON RED A/P 16FR (CATHETERS) ×3 IMPLANT
CLOTH BEACON ORANGE TIMEOUT ST (SAFETY) ×3 IMPLANT
CONTAINER PREFILL 10% NBF 60ML (FORM) ×6 IMPLANT
ELECT REM PT RETURN 9FT ADLT (ELECTROSURGICAL) ×3
ELECTRODE REM PT RTRN 9FT ADLT (ELECTROSURGICAL) ×2 IMPLANT
ELECTRODE RT ANGLE VERSAPOINT (CUTTING LOOP) ×3 IMPLANT
GLOVE BIO SURGEON STRL SZ 6.5 (GLOVE) ×6 IMPLANT
GLOVE BIOGEL PI IND STRL 6 (GLOVE) ×2 IMPLANT
GLOVE BIOGEL PI IND STRL 6.5 (GLOVE) ×4 IMPLANT
GLOVE BIOGEL PI INDICATOR 6 (GLOVE) ×1
GLOVE BIOGEL PI INDICATOR 6.5 (GLOVE) ×2
GLOVE ECLIPSE 6.0 STRL STRAW (GLOVE) ×6 IMPLANT
GOWN PREVENTION PLUS LG XLONG (DISPOSABLE) ×3 IMPLANT
GOWN STRL REIN XL XLG (GOWN DISPOSABLE) ×6 IMPLANT
INCISOR TRUC PLUS BLADE 2.9 (ABLATOR) ×3
KIT HYSTEROSCOPY TRUCLEAR (ABLATOR) ×3 IMPLANT
LOOP ANGLED CUTTING 22FR (CUTTING LOOP) IMPLANT
NEEDLE SPNL 20GX3.5 QUINCKE YW (NEEDLE) ×3 IMPLANT
PACK HYSTEROSCOPY LF (CUSTOM PROCEDURE TRAY) ×3 IMPLANT
TOWEL OR 17X24 6PK STRL BLUE (TOWEL DISPOSABLE) ×6 IMPLANT
WATER STERILE IRR 1000ML POUR (IV SOLUTION) ×3 IMPLANT

## 2012-06-01 NOTE — Anesthesia Postprocedure Evaluation (Signed)
Anesthesia Post Note  Patient: [name]  Procedure(s) Performed: Procedure(s) (LRB): DILATATION AND CURETTAGE /HYSTEROSCOPY (N/A)  Anesthesia type: General  Patient location: PACU  Post pain: Pain level controlled  Post assessment: Post-op Vital signs reviewed  Last Vitals:  Filed Vitals:   06/01/12 0815  BP: 124/80  Pulse: 82  Temp: 36.5 C  Resp: 18    Post vital signs: Reviewed  Level of consciousness: sedated  Complications: No apparent anesthesia complicationsfj

## 2012-06-01 NOTE — Progress Notes (Signed)
Plan of care discussed with pt.  Questions answered, informed consent

## 2012-06-01 NOTE — Anesthesia Procedure Notes (Signed)
Procedure Name: LMA Insertion Date/Time: 06/01/2012 7:33 AM Performed by: Graciela Husbands Pre-anesthesia Checklist: Suction available, Emergency Drugs available, Timeout performed, Patient identified and Patient being monitored Patient Re-evaluated:Patient Re-evaluated prior to inductionOxygen Delivery Method: Circle system utilized Preoxygenation: Pre-oxygenation with 100% oxygen Intubation Type: IV induction and Combination inhalational/ intravenous induction Ventilation: Mask ventilation with difficulty and Two handed mask ventilation required LMA: LMA inserted LMA Size: 4.0 Number of attempts: 1 Placement Confirmation: positive ETCO2 and breath sounds checked- equal and bilateral Tube secured with: Tape Dental Injury: Teeth and Oropharynx as per pre-operative assessment  Difficulty Due To: Difficult Airway- due to limited oral opening

## 2012-06-01 NOTE — Op Note (Signed)
NAMEYAN, OKRAY              ACCOUNT NO.:  [id number]  MEDICAL RECORD NO.:  [id number]  LOCATION:  [facility]                          [facility]  [facility]  PHYSICIAN:  Zelphia Cairo, MD    DATE OF BIRTH:  19-Nov-1975  DATE OF PROCEDURE: DATE OF DISCHARGE:                              OPERATIVE REPORT   PREOPERATIVE DIAGNOSIS:  Endometrial polyp and infertility.  POSTOPERATIVE DIAGNOSIS:  Endometrial polyp and infertility, path pending.  PROCEDURE: 1. Cervical block. 2. Hysteroscopy. 3. Resection of endometrial polyp with TRUCLEAR.  SURGEON:  Zelphia Cairo, MD  ANESTHESIA:  General.  SPECIMEN:  Endometrial polyps.  COMPLICATIONS:  None.  CONDITION:  Stable to recovery room.  DEFICIT:  100 mL of saline.  PROCEDURE:  The patient was taken to the operating room.  After informed consent was obtained, she was given general anesthesia, placed in the dorsal lithotomy position using Allen stirrups.  She was prepped and draped in sterile fashion.  An in and out catheter was used to drain her bladder.  Bivalve speculum was placed in the vagina and 1 mL of 1% lidocaine was injected at the 12 o'clock position of the cervix.  A single-tooth tenaculum was placed on the anterior lip of the cervix and the remaining 9 mL of lidocaine was used to perform a cervical block. The cervix was then serially dilated using Pratt dilators and the hysteroscope was inserted into the endometrial cavity.  At this time, endometrial polyp was visualized.  The right ostia was visualized and appeared normal.  The patient's left ostia could not be visualized because I could not get the scope into proper position to visualize it.  The hysteroscope was placed in position to visualize the endometrial polyp and the TRUECLEAR device was inserted through the hysteroscope.  It was positioned properly and the endometrial polyp was resected without difficulty.  Once the polyp was removed to the base, the  hysteroscope was removed because no other masses or abnormalities were seen.  The tenaculum was removed.  The cervix was hemostatic.  The speculum was removed.  The patient was extubated and taken to the recovery room in stable condition.  Sponge, lap, needle, and instrument counts were correct x2.     Zelphia Cairo, MD     GA/MEDQ  D:  06/01/2012  T:  06/01/2012  Job:  [id number]

## 2012-06-01 NOTE — Transfer of Care (Signed)
Immediate Anesthesia Transfer of Care Note  Patient: [name]  Procedure(s) Performed: Procedure(s) (LRB) with comments: DILATATION AND CURETTAGE /HYSTEROSCOPY (N/A) - with truclear  Patient Location: PACU  Anesthesia Type: General  Level of Consciousness: awake, alert  and oriented  Airway & Oxygen Therapy: Patient Spontanous Breathing and Patient connected to nasal cannula oxygen  Post-op Assessment: Report given to PACU RN and Post -op Vital signs reviewed and stable  Post vital signs: Reviewed and stable  Complications: No apparent anesthesia complications

## 2012-06-01 NOTE — Anesthesia Preprocedure Evaluation (Signed)
Anesthesia Evaluation  Patient identified by MRN, date of birth, ID band Patient awake    Reviewed: Allergy & Precautions, H&P , NPO status , Patient's Chart, lab work & pertinent test results  Airway Mallampati: II TM Distance: >3 FB Neck ROM: full    Dental No notable dental hx.    Pulmonary    Pulmonary exam normal       Cardiovascular     Neuro/Psych negative neurological ROS  negative psych ROS   GI/Hepatic negative GI ROS, Neg liver ROS,   Endo/Other  Hypothyroidism Morbid obesity  Renal/GU negative Renal ROS  negative genitourinary   Musculoskeletal negative musculoskeletal ROS (+)   Abdominal (+) + obese,   Peds negative pediatric ROS (+)  Hematology negative hematology ROS (+)   Anesthesia Other Findings   Reproductive/Obstetrics negative OB ROS                           Anesthesia Physical Anesthesia Plan  ASA: III  Anesthesia Plan: General   Post-op Pain Management:    Induction: Intravenous  Airway Management Planned: LMA  Additional Equipment:   Intra-op Plan:   Post-operative Plan:   Informed Consent: I have reviewed the patients History and Physical, chart, labs and discussed the procedure including the risks, benefits and alternatives for the proposed anesthesia with the patient or authorized representative who has indicated his/her understanding and acceptance.     Plan Discussed with: CRNA and Surgeon  Anesthesia Plan Comments:         Anesthesia Quick Evaluation

## 2012-06-04 ENCOUNTER — Encounter (HOSPITAL_COMMUNITY): Payer: Self-pay | Admitting: Obstetrics and Gynecology

## 2012-06-18 ENCOUNTER — Other Ambulatory Visit (HOSPITAL_COMMUNITY): Payer: Self-pay | Admitting: Gynecology

## 2012-06-18 DIAGNOSIS — Z3141 Encounter for fertility testing: Secondary | ICD-10-CM

## 2012-06-19 ENCOUNTER — Ambulatory Visit (HOSPITAL_COMMUNITY)
Admission: RE | Admit: 2012-06-19 | Discharge: 2012-06-19 | Disposition: A | Discharge status: Home / Self Care | Payer: 59 | Source: Ambulatory Visit | Attending: Gynecology | Admitting: Gynecology

## 2012-06-19 DIAGNOSIS — Z3141 Encounter for fertility testing: Secondary | ICD-10-CM

## 2012-06-19 DIAGNOSIS — N979 Female infertility, unspecified: Secondary | ICD-10-CM | POA: Insufficient documentation

## 2012-06-19 MED ORDER — IOHEXOL 300 MG/ML  SOLN: 8.0000 mL | Freq: Once | INTRAMUSCULAR | Status: AC | PRN

## 2012-09-21 ENCOUNTER — Encounter (INDEPENDENT_AMBULATORY_CARE_PROVIDER_SITE_OTHER): Payer: Self-pay | Admitting: General Surgery

## 2012-09-21 ENCOUNTER — Ambulatory Visit (INDEPENDENT_AMBULATORY_CARE_PROVIDER_SITE_OTHER): Payer: 59 | Admitting: General Surgery

## 2012-09-21 VITALS — BP 132/76 | HR 85 | Temp 97.8°F | Resp 18 | Ht 62.5 in | Wt 259.2 lb

## 2012-09-21 DIAGNOSIS — E669 Obesity, unspecified: Secondary | ICD-10-CM

## 2012-09-21 DIAGNOSIS — E039 Hypothyroidism, unspecified: Secondary | ICD-10-CM

## 2012-09-21 DIAGNOSIS — I10 Essential (primary) hypertension: Secondary | ICD-10-CM

## 2012-09-21 DIAGNOSIS — Z6841 Body Mass Index (BMI) 40.0 and over, adult: Secondary | ICD-10-CM

## 2012-09-21 NOTE — Progress Notes (Signed)
Patient ID: [name], female   DOB: 01/11/1976, 36 y.o.   MRN: [id number]  Chief Complaint  Patient presents with  . Bariatric Pre-op    sleeve    HPI [name] is a 36 y.o. female.  This patient presents for her initial weight loss surgery evaluation. She has a tender information session and is interested in a vertical sleeve gastrectomy. She has a BMI of 46.6 with obesity related comorbidities of prediabetes, hypothyroidism, polycystic ovarian syndrome, and hyperlipidemia, and infertility which her infertility specialist thinks is due to her obesity. She also says that she's not a candidate for in vitro fertilization due to her BMI. This is one of her main reasons for pursuing weight loss surgery is for her fertility. She says that she struggled with her weight for about 5 years since she was diagnosed with being hypothyroid. She is followed by an endocrinologist for this. She says that she doesn't do much exercise other than walking her dog and some of this is limited by her plantar fasciitis. She has seen a nutritionist a rate of and has tried several diets including Atkins diets and Weight Watchers but she has only regain the weight after losing a small amount. She had a history of reflux previously about 3 years ago and she tried some medication for this about 2 weeks which gave her good improvement of her symptoms but she stopped the medication and has not had any issues since then. HPI  Past Medical History  Diagnosis Date  . Hypothyroidism   . Polycystic ovarian syndrome   . Hypertension     recent high readings- no meds  . Asthma     has rescue inhaler    Past Surgical History  Procedure Date  . Plantar fascia release   . Hysteroscopy w/d&c 06/01/2012    Procedure: DILATATION AND CURETTAGE /HYSTEROSCOPY;  Surgeon: Zelphia Cairo, MD;  Location: [facility];  Service: Gynecology;  Laterality: N/A;  with truclear  . Cervical polypectomy     05/2012  . Planter   . Planter    . Plantar fascia surgery 05/2011    left foot  . Plantar fascia surgery 04/2011    rt foot    Family History  Problem Relation Age of Onset  . Cancer Maternal Aunt     breast and colon    Social History History  Substance Use Topics  . Smoking status: Light Tobacco Smoker  . Smokeless tobacco: Not on file  . Alcohol Use: Not on file    No Known Allergies  Current Outpatient Prescriptions  Medication Sig Dispense Refill  . metFORMIN (GLUCOPHAGE) 500 MG tablet Take 1,000 mg by mouth daily with breakfast. Polycystic Ovary Syndrome.      Marland Kitchen PRESCRIPTION MEDICATION Inhale 2 puffs into the lungs daily as needed. For asthma. Vental Compositum inhaler (original inhaler from [place])-contains 0.05mg  of beclomethasone and 0.1mg  of salbutamol.      . thyroid (ARMOUR) 15 MG tablet Take 15 mg by mouth daily. *Takes BOTH Armour Thyroid 60mg  + 15mg  TOGETHER.*      . thyroid (ARMOUR) 60 MG tablet Take 60 mg by mouth daily. *Takes BOTH Armour Thyroid 60mg  + 15mg  TOGETHER.*      . meloxicam (MOBIC) 15 MG tablet Take 15 mg by mouth daily.        Review of Systems Review of Systems All other review of systems negative or noncontributory except as stated in the HPI  Blood pressure 132/76, pulse 85, temperature  97.8 F (36.6 C), temperature source Temporal, resp. rate 18, height 5' 2.5" (1.588 m), weight 259 lb 3.2 oz (117.572 kg).  Physical Exam Physical Exam Physical Exam  Nursing note and vitals reviewed. Constitutional: She is oriented to person, place, and time. She appears well-developed and well-nourished. No distress.  HENT:  Head: Normocephalic and atraumatic.  Mouth/Throat: No oropharyngeal exudate.  Eyes: Conjunctivae and EOM are normal. Pupils are equal, round, and reactive to light. Right eye exhibits no discharge. Left eye exhibits no discharge. No scleral icterus.  Neck: Normal range of motion. Neck supple. No tracheal deviation present.  Cardiovascular: Normal rate, regular  rhythm, normal heart sounds and intact distal pulses.   Pulmonary/Chest: Effort normal and breath sounds normal. No stridor. No respiratory distress. She has no wheezes.  Abdominal: Soft. Bowel sounds are normal. She exhibits no distension and no mass. There is no tenderness. There is no rebound and no guarding.  Musculoskeletal: Normal range of motion. She exhibits no edema and no tenderness.  Neurological: She is alert and oriented to person, place, and time.  Skin: Skin is warm and dry. No rash noted. She is not diaphoretic. No erythema. No pallor.  Psychiatric: She has a normal mood and affect. Her behavior is normal. Judgment and thought content normal.    Data Reviewed   Assessment    Morbid obesity with a BMI of 46.6 and obesity related comorbidities of prediabetes, hyperlipidemia, polycystic ovarian syndrome, hypothyroidism, and infertility  Discussions about all of the medical and surgical weight loss options. We discussed the option for lap band, sleeve gastrectomy, and Roux-en-Y gastric bypass. We discussed the pros and cons and risks and benefits of each. The risks of infection, bleeding, pain, scarring, weight regain, too little or too much weight loss, vitamin deficiencies and need for lifelong vitamin supplementation, hair loss, need for protein supplementation, leaks, stricture, reflux, food intolerance, need for reoperation and conversion to roux Y gastric bypass, need for open surgery, injury to spleen or surrounding structures, DVT's, PE, and death again discussed with the patient and the patient expressed understanding and desires to proceed with laparoscopic vertical sleeve gastrectomy, possible open, intraoperative endoscopy. She remains interested in the vertical sleeve gastrectomy. She is an occasional smoker and I explained that we would not be offering her any surgery until she is completely smoke-free. She has already completed much of the workup at [facility] and we  will obtain the records from there.0 I would like her to see her nutritionist. We will also check some nutrition labs and preoperative labs     Plan    We will start her on the 2 weight loss surgery including laboratory studies, upper GI, and nutrition and cytology evaluations. We will obtain her records from [facility] and she will require nicotine test prior to her procedure to ensure that she is completely smoke-free       Floria Brandau DAVID 09/21/2012, 11:34 AM

## 2012-10-02 LAB — COMPREHENSIVE METABOLIC PANEL
ALT: 12 U/L (ref 0–35)
Albumin: 4.2 g/dL (ref 3.5–5.2)
Alkaline Phosphatase: 100 U/L (ref 39–117)
CO2: 29 mEq/L (ref 19–32)
Potassium: 4.2 mEq/L (ref 3.5–5.3)
Sodium: 137 mEq/L (ref 135–145)
Total Bilirubin: 0.3 mg/dL (ref 0.3–1.2)
Total Protein: 7 g/dL (ref 6.0–8.3)

## 2012-10-02 LAB — LIPID PANEL
HDL: 34 mg/dL — ABNORMAL LOW (ref >39)
LDL Cholesterol: 115 mg/dL — ABNORMAL HIGH (ref 0–99)
Total CHOL/HDL Ratio: 5.7 Ratio

## 2012-10-03 LAB — CBC
Hemoglobin: 13.3 g/dL (ref 12.0–15.0)
MCH: 25.3 pg — ABNORMAL LOW (ref 26.0–34.0)
MCHC: 34.3 g/dL (ref 30.0–36.0)
Platelets: 261 10*3/uL (ref 150–400)
RBC: 5.26 MIL/uL — ABNORMAL HIGH (ref 3.87–5.11)

## 2012-10-05 ENCOUNTER — Encounter: Payer: Self-pay | Admitting: *Deleted

## 2012-10-05 ENCOUNTER — Encounter: Payer: 59 | Attending: General Surgery | Admitting: *Deleted

## 2012-10-05 DIAGNOSIS — Z713 Dietary counseling and surveillance: Secondary | ICD-10-CM | POA: Insufficient documentation

## 2012-10-05 DIAGNOSIS — Z01818 Encounter for other preprocedural examination: Secondary | ICD-10-CM | POA: Insufficient documentation

## 2012-10-05 NOTE — Progress Notes (Signed)
  Pre-Op Assessment Visit:  Pre-Operative Gastric Sleeve Surgery  Medical Nutrition Therapy:  Appt start time:  0800   End time:  0900.  Patient was seen on 10/05/12 for Pre-Operative Gastric Sleeve Nutrition Assessment. Assessment and letter of approval faxed to [facility] Bariatric Surgery Program coordinator on 10/08/12.  Approval letter sent to [facility] center and will be available in the chart under the media tab.  Handouts given during visit include:  Pre-Op Goals   Bariatric Surgery Protein Shakes  Samples given during visit include:  (1) Freedavite MVI Lot # [id number]; Exp: 01/17   Patient to call for Pre-Op and Post-Op Nutrition Education at [facility] when surgery is scheduled.

## 2012-10-05 NOTE — Patient Instructions (Addendum)
   Follow Pre-Op Nutrition Goals to prepare for Gastric Sleeve Surgery.   Call the [facility] at [phone] once you have been given your surgery date to enrolled in the Pre-Op Nutrition Class. You will need to attend this nutrition class 3-4 weeks prior to your surgery.  

## 2012-10-07 ENCOUNTER — Encounter: Payer: Self-pay | Admitting: *Deleted

## 2012-10-29 ENCOUNTER — Telehealth (INDEPENDENT_AMBULATORY_CARE_PROVIDER_SITE_OTHER): Payer: Self-pay

## 2012-10-29 DIAGNOSIS — Z01818 Encounter for other preprocedural examination: Secondary | ICD-10-CM

## 2012-10-29 NOTE — Telephone Encounter (Signed)
Message copied by Maryan Puls on Mon Oct 29, 2012  3:52 PM ------      Message from: Leandrew Koyanagi      Created: Mon Oct 29, 2012 12:36 PM      Regarding: Ready to schedule surgery       Hi Jessi Pitstick and Dr Biagio Quint,            Kamia is approved and ready to schedule her sleeve gastrectomy.            I requested clearance/follow-up on the hypothyroidism from Dr Talmage Nap on 10/03/12 and her office sent the office note from 08/08/12 showing Dr Talmage Nap had increased the patients meds, Armour Thyroid Tablet. I have sent another request to Dr Talmage Nap concerning the high TSH.            There is also a note in the chart to order a nicotine test prior to surgery.            I have placed her chart in your box, Dr. Biagio Quint, for surgery orders. Your next available surgery date is 11/19/12. Is it ok to schedule her on that date?            Please advise. :-)            Thanks,            Coy Saunas

## 2012-10-29 NOTE — Telephone Encounter (Signed)
Called patient to discuss thyroid results.  Patient states she is scheduled for lab work from Dr. Talmage Nap to recheck her Thyroid Levels.  Patient also advised that she will need to have nicotine test prior to scheduling surgery.  Patient will call our office when she has completed her lab work and request Dr. Talmage Nap to forward a copy to Dr. Biagio Quint.  Will mail lab order slip for nicotine test to patient home address, patient requested [facility].

## 2012-10-30 ENCOUNTER — Encounter (INDEPENDENT_AMBULATORY_CARE_PROVIDER_SITE_OTHER): Payer: Self-pay

## 2012-11-01 ENCOUNTER — Other Ambulatory Visit (INDEPENDENT_AMBULATORY_CARE_PROVIDER_SITE_OTHER): Payer: Self-pay | Admitting: General Surgery

## 2012-11-01 ENCOUNTER — Encounter: Payer: 59 | Attending: General Surgery | Admitting: *Deleted

## 2012-11-01 DIAGNOSIS — Z01818 Encounter for other preprocedural examination: Secondary | ICD-10-CM | POA: Insufficient documentation

## 2012-11-01 DIAGNOSIS — Z713 Dietary counseling and surveillance: Secondary | ICD-10-CM | POA: Insufficient documentation

## 2012-11-01 DIAGNOSIS — E669 Obesity, unspecified: Secondary | ICD-10-CM

## 2012-11-02 NOTE — Progress Notes (Signed)
Bariatric Class:  Appt start time: 1730 end time:  1830.  Pre-Operative Nutrition Class  Patient was seen on 11/01/12 for Pre-Operative Bariatric Surgery Education at [facility].   Surgery date: 11/19/12 Surgery type: Sleeve Start weight at [facility]: 261.5 lbs (10/05/12) Goal weight: 135 lbs  Weight today: 262.2 lbs BMI: 47.2 kg/m^2  Samples given per MNT protocol: 1 each Bariatric Advantage Multivitamin Lot # [id number]; Exp: 06/15  Bariatric Advantage Calcium Citrate Lot # [id number]; Exp:10/15  Bariatric Advantage Sublingual B12 Lot # [id number]; Exp:10/15  Celebrate Vitamins Multivitamin Complete (w/ iron) Lot # [id number]; Exp: 11/14  Celebrate Vitamins Multivitamin Lot # [id number]; Exp: 07/15  Celebrate Vitamins Iron + C (30 mg) Lot # [id number]; Exp: 09/15  Celebrate Vitamins Calcium Citrate Lot # [id number]; Exp: 09/15  Unjury Protein Powder Lot # [id number]; Exp: 03/15  Premier Protein Shake Lot # [id number]; Exp: 08/03/13  The following the learning objective met by the patient during this course:  Identifies Pre-Op Dietary Goals and will begin 2 weeks pre-operatively  Identifies appropriate sources of fluids and proteins   States protein recommendations and appropriate sources pre and post-operatively  Identifies Post-Operative Dietary Goals and will follow for 2 weeks post-operatively  Identifies appropriate multivitamin and calcium sources  Describes the need for physical activity post-operatively and will follow MD recommendations  States when to call healthcare provider regarding medication questions or post-operative complications  Handouts given during class include:  Pre-Op Bariatric Surgery Diet Handout  Protein Shake Handout  Post-Op Bariatric Surgery Nutrition Handout  BELT Program Information Flyer  Support Group Information Flyer  [facility] Bariatric Supplements Price List  Follow-Up Plan: Patient will follow-up at  [facility] 2 weeks post operatively for diet advancement per MD.

## 2012-11-02 NOTE — Patient Instructions (Signed)
Follow:   Pre-Op Diet per MD 2 weeks prior to surgery  Phase 2- Liquids (clear/full) 2 weeks after surgery  Vitamin/Mineral/Calcium guidelines for purchasing bariatric supplements  Exercise guidelines pre and post-op per MD  Follow-up at [facility] in 2 weeks post-op for diet advancement. Contact Raesha Coonrod as needed with questions/concerns. 

## 2012-11-07 ENCOUNTER — Encounter (HOSPITAL_COMMUNITY): Payer: Self-pay | Admitting: Pharmacy Technician

## 2012-11-07 LAB — NICOTINE/COTININE METABOLITES
Cotinine: NOT DETECTED ng/mL
Nicotine: NOT DETECTED ng/mL

## 2012-11-10 ENCOUNTER — Other Ambulatory Visit: Payer: Self-pay

## 2012-11-13 ENCOUNTER — Encounter (HOSPITAL_COMMUNITY)
Admission: RE | Admit: 2012-11-13 | Discharge: 2012-11-13 | Disposition: A | Discharge status: Home / Self Care | Payer: 59 | Source: Ambulatory Visit | Attending: General Surgery | Admitting: General Surgery

## 2012-11-13 ENCOUNTER — Other Ambulatory Visit (HOSPITAL_COMMUNITY): Payer: Self-pay | Admitting: General Surgery

## 2012-11-13 ENCOUNTER — Ambulatory Visit (HOSPITAL_COMMUNITY)
Admission: RE | Admit: 2012-11-13 | Discharge: 2012-11-13 | Disposition: A | Discharge status: Home / Self Care | Payer: 59 | Source: Ambulatory Visit | Attending: General Surgery | Admitting: General Surgery

## 2012-11-13 ENCOUNTER — Encounter (HOSPITAL_COMMUNITY): Payer: Self-pay

## 2012-11-13 VITALS — HR 72 | Temp 97.8°F | Resp 18 | Ht 62.5 in | Wt 256.4 lb

## 2012-11-13 DIAGNOSIS — Z01812 Encounter for preprocedural laboratory examination: Secondary | ICD-10-CM | POA: Insufficient documentation

## 2012-11-13 DIAGNOSIS — E669 Obesity, unspecified: Secondary | ICD-10-CM

## 2012-11-13 HISTORY — DX: Anemia, unspecified: D64.9

## 2012-11-13 LAB — CBC WITH DIFFERENTIAL/PLATELET
Basophils Absolute: 0 10*3/uL (ref 0.0–0.1)
Basophils Relative: 0 % (ref 0–1)
Eosinophils Absolute: 0.2 10*3/uL (ref 0.0–0.7)
MCH: 25.9 pg — ABNORMAL LOW (ref 26.0–34.0)
MCHC: 34 g/dL (ref 30.0–36.0)
Monocytes Relative: 7 % (ref 3–12)
Neutro Abs: 5.3 10*3/uL (ref 1.7–7.7)
Neutrophils Relative %: 64 % (ref 43–77)
Platelets: 237 10*3/uL (ref 150–400)
RDW: 12.8 % (ref 11.5–15.5)

## 2012-11-13 LAB — COMPREHENSIVE METABOLIC PANEL
AST: 15 U/L (ref 0–37)
Albumin: 3.7 g/dL (ref 3.5–5.2)
Alkaline Phosphatase: 95 U/L (ref 39–117)
BUN: 17 mg/dL (ref 6–23)
Potassium: 4 mEq/L (ref 3.5–5.1)
Sodium: 138 mEq/L (ref 135–145)
Total Protein: 7.6 g/dL (ref 6.0–8.3)

## 2012-11-13 LAB — HCG, SERUM, QUALITATIVE: Preg, Serum: NEGATIVE

## 2012-11-13 NOTE — Patient Instructions (Signed)
20 [name]  11/13/2012   Your procedure is scheduled on: 11-19-2012  Report to [facility] at 0515 AM.  Call this number if you have problems the morning of surgery [phone]   Remember:   Do not eat food or drink liquids :After Midnight.     Take these medicines the morning of surgery with A SIP OF WATER: armour thryoid, ventolin inhaler if needed bring and leave with your spouse.                                 SEE  PREPARING FOR SURGERY SHEET   Do not wear jewelry, make-up or nail polish.  Do not wear lotions, powders, or perfumes. You may wear deodorant.   Men may shave face and neck.  Do not bring valuables to the hospital.  Contacts, dentures or bridgework may not be worn into surgery.  Leave suitcase in the car. After surgery it may be brought to your room.  For patients admitted to the hospital, checkout time is 11:00 AM the day of discharge.   Patients discharged the day of surgery will not be allowed to drive home.  Name and phone number of your driver:  Special Instructions: N/A   Please read over the following fact sheets that you were given: MRSA Information.  Call [name] RN pre op nurse if needed [phone]    FAILURE TO FOLLOW THESE INSTRUCTIONS MAY RESULT IN THE CANCELLATION OF YOUR SURGERY. PATIENT SIGNATURE___________________________________________

## 2012-11-13 NOTE — Progress Notes (Signed)
11/13/12 0816  OBSTRUCTIVE SLEEP APNEA  Have you ever been diagnosed with sleep apnea through a sleep study? No  Do you snore loudly (loud enough to be heard through closed doors)?  1  Do you often feel tired, fatigued, or sleepy during the daytime? 1  Has anyone observed you stop breathing during your sleep? 0  Do you have, or are you being treated for high blood pressure? 1  BMI more than 35 kg/m2? 1  Age over 35 years old? 0  Neck circumference greater than 40 cm/18 inches? 0  Gender: 0  Obstructive Sleep Apnea Score 4  Score 4 or greater  Results sent to PCP

## 2012-11-14 ENCOUNTER — Ambulatory Visit (INDEPENDENT_AMBULATORY_CARE_PROVIDER_SITE_OTHER): Payer: 59 | Admitting: General Surgery

## 2012-11-14 ENCOUNTER — Encounter (INDEPENDENT_AMBULATORY_CARE_PROVIDER_SITE_OTHER): Payer: Self-pay | Admitting: General Surgery

## 2012-11-14 NOTE — Progress Notes (Signed)
Patient ID: [name], female   DOB: 05/14/1976, 36 y.o.   MRN: [id number]  Chief Complaint  Patient presents with  . Bariatric Pre-op    HPI [name] is a 36 y.o. female.  This patient presents for her preoperative surgery evaluation in preparation forvertical sleeve gastrectomy next week. She has a BMI of 46 with comorbidities of polycystic ovarian syndrome and hypothyroidism and asthma. She is interested in a sleeve gastrectomy and has had her upper GI, labs, and has been cleared by the psychologist and nutritionist. She is on the preoperative diet and has lost about 8-9 pounds HPI  Past Medical History  Diagnosis Date  . Hypothyroidism   . Polycystic ovarian syndrome   . Asthma     has rescue inhaler  . Obesity   . Hyperlipidemia   . Plantar fasciitis, bilateral   . Hypertension     r sept 2013 high readings- no meds  . Anemia     Past Surgical History  Procedure Laterality Date  . Plantar fascia release    . Hysteroscopy w/d&c  06/01/2012    Procedure: DILATATION AND CURETTAGE /HYSTEROSCOPY;  Surgeon: Gretchen Adkins, MD;  Location: [facility];  Service: Gynecology;  Laterality: N/A;  with truclear  . Cervical polypectomy      05/2012  . Planter Left sept 2012  . Planter    . Plantar fascia surgery  05/2011    left foot  . Plantar fascia surgery  04/2011    rt foot    Family History  Problem Relation Age of Onset  . Cancer Maternal Aunt     breast and colon    Social History History  Substance Use Topics  . Smoking status: Former Smoker    Types: Cigarettes    Quit date: 10/29/2012  . Smokeless tobacco: Never Used  . Alcohol Use: No    No Known Allergies  Current Outpatient Prescriptions  Medication Sig Dispense Refill  . metFORMIN (GLUMETZA) 1000 MG (MOD) 24 hr tablet Take 1,000 mg by mouth every evening.       . minocycline (MINOCIN,DYNACIN) 100 MG capsule Take 100 mg by mouth 2 (two) times daily.      . thyroid (ARMOUR) 90 MG tablet Take 90  mg by mouth daily before breakfast.       . VENTOLIN HFA 108 (90 BASE) MCG/ACT inhaler Inhale 1 puff into the lungs every 4 (four) hours as needed for wheezing or shortness of breath.        No current facility-administered medications for this visit.    Review of Systems Review of Systems All other review of systems negative or noncontributory except as stated in the HPI  Blood pressure 128/76, pulse 67, temperature 97.8 F (36.6 C), temperature source Temporal, resp. rate 14, height 5' 2.5" (1.588 m), weight 253 lb 12.8 oz (115.123 kg), last menstrual period 10/14/2012.  Physical Exam Physical Exam Physical Exam  Nursing note and vitals reviewed. Constitutional: She is oriented to person, place, and time. She appears well-developed and well-nourished. No distress.  HENT:  Head: Normocephalic and atraumatic.  Mouth/Throat: No oropharyngeal exudate.  Eyes: Conjunctivae and EOM are normal. Pupils are equal, round, and reactive to light. Right eye exhibits no discharge. Left eye exhibits no discharge. No scleral icterus.  Neck: Normal range of motion. Neck supple. No tracheal deviation present.  Cardiovascular: Normal rate, regular rhythm, normal heart sounds and intact distal pulses.   Pulmonary/Chest: Effort normal and breath sounds normal. No   stridor. No respiratory distress. She has no wheezes.  Abdominal: Soft. Bowel sounds are normal. She exhibits no distension and no mass. There is no tenderness. There is no rebound and no guarding.  Musculoskeletal: Normal range of motion. She exhibits no edema and no tenderness.  Neurological: She is alert and oriented to person, place, and time.  Skin: Skin is warm and dry. No rash noted. She is not diaphoretic. No erythema. No pallor.  Psychiatric: She has a normal mood and affect. Her behavior is normal. Judgment and thought content normal.    Data Reviewed   Assessment    Morbid obesity with a BMI of 46 and comorbidities of polycystic  ovarian syndrome, hypothyroidism, and asthma Via a long discussion regarding the surgical and nonsurgical weight loss options she does remain interested in a sleeve gastrectomy although she did have several questions and shows some normal apprehension preoperatively. We discussed all of her concerns and the perioperative course including the risks and benefits of the procedure. The risks of infection, bleeding, pain, scarring, weight regain, too little or too much weight loss, vitamin deficiencies and need for lifelong vitamin supplementation, hair loss, need for protein supplementation, leaks, stricture, reflux, food intolerance, need for reoperation and conversion to roux Y gastric bypass, need for open surgery, injury to spleen or surrounding structures, DVT's, PE, and death again discussed with the patient and the patient expressed understanding and desires to proceed with laparoscopic vertical sleeve gastrectomy, possible open, intraoperative endoscopy.     Plan    We will proceed with vertical sleeve gastrectomy is scheduled next week        Jeronimo Hellberg DAVID 11/14/2012, 9:56 AM    

## 2012-11-18 NOTE — Anesthesia Preprocedure Evaluation (Addendum)
Anesthesia Evaluation  Patient identified by MRN, date of birth, ID band Patient awake    Reviewed: Allergy & Precautions, H&P , NPO status , Patient's Chart, lab work & pertinent test results  Airway Mallampati: II TM Distance: >3 FB Neck ROM: Full    Dental no notable dental hx.    Pulmonary asthma ,  breath sounds clear to auscultation  Pulmonary exam normal       Cardiovascular hypertension, Pt. on medications Rhythm:Regular Rate:Normal     Neuro/Psych negative neurological ROS  negative psych ROS   GI/Hepatic negative GI ROS, Neg liver ROS,   Endo/Other  Hypothyroidism Morbid obesity  Renal/GU negative Renal ROS  negative genitourinary   Musculoskeletal negative musculoskeletal ROS (+)   Abdominal (+) + obese,   Peds negative pediatric ROS (+)  Hematology negative hematology ROS (+)   Anesthesia Other Findings   Reproductive/Obstetrics negative OB ROS                          Anesthesia Physical Anesthesia Plan  ASA: III  Anesthesia Plan: General   Post-op Pain Management:    Induction: Intravenous  Airway Management Planned: Oral ETT  Additional Equipment:   Intra-op Plan:   Post-operative Plan: Extubation in OR  Informed Consent: I have reviewed the patients History and Physical, chart, labs and discussed the procedure including the risks, benefits and alternatives for the proposed anesthesia with the patient or authorized representative who has indicated his/her understanding and acceptance.   Dental advisory given  Plan Discussed with: CRNA  Anesthesia Plan Comments:         Anesthesia Quick Evaluation

## 2012-11-19 ENCOUNTER — Encounter (HOSPITAL_COMMUNITY)
Admission: RE | Disposition: A | Discharge status: Home / Self Care | Payer: Self-pay | Source: Ambulatory Visit | Attending: General Surgery

## 2012-11-19 ENCOUNTER — Encounter (HOSPITAL_COMMUNITY): Payer: Self-pay | Admitting: *Deleted

## 2012-11-19 ENCOUNTER — Ambulatory Visit (HOSPITAL_COMMUNITY): Payer: 59 | Admitting: Anesthesiology

## 2012-11-19 ENCOUNTER — Inpatient Hospital Stay (HOSPITAL_COMMUNITY)
Admission: RE | Admit: 2012-11-19 | Discharge: 2012-11-21 | DRG: 621 | Disposition: A | Discharge status: Home / Self Care | Payer: 59 | Source: Ambulatory Visit | Attending: General Surgery | Admitting: General Surgery

## 2012-11-19 ENCOUNTER — Encounter (HOSPITAL_COMMUNITY): Payer: Self-pay | Admitting: Anesthesiology

## 2012-11-19 DIAGNOSIS — E669 Obesity, unspecified: Secondary | ICD-10-CM

## 2012-11-19 DIAGNOSIS — J45909 Unspecified asthma, uncomplicated: Secondary | ICD-10-CM

## 2012-11-19 DIAGNOSIS — E039 Hypothyroidism, unspecified: Secondary | ICD-10-CM | POA: Diagnosis present

## 2012-11-19 DIAGNOSIS — E282 Polycystic ovarian syndrome: Secondary | ICD-10-CM | POA: Diagnosis present

## 2012-11-19 DIAGNOSIS — E785 Hyperlipidemia, unspecified: Secondary | ICD-10-CM | POA: Diagnosis present

## 2012-11-19 DIAGNOSIS — Z6841 Body Mass Index (BMI) 40.0 and over, adult: Secondary | ICD-10-CM

## 2012-11-19 DIAGNOSIS — Z87891 Personal history of nicotine dependence: Secondary | ICD-10-CM

## 2012-11-19 DIAGNOSIS — Z79899 Other long term (current) drug therapy: Secondary | ICD-10-CM

## 2012-11-19 DIAGNOSIS — I10 Essential (primary) hypertension: Secondary | ICD-10-CM | POA: Diagnosis present

## 2012-11-19 HISTORY — PX: LAPAROSCOPIC GASTRIC SLEEVE RESECTION: SHX5895

## 2012-11-19 HISTORY — PX: ESOPHAGOGASTRODUODENOSCOPY: SHX5428

## 2012-11-19 LAB — GLUCOSE, CAPILLARY

## 2012-11-19 SURGERY — GASTRECTOMY, SLEEVE, LAPAROSCOPIC
Anesthesia: General | Site: Esophagus | Wound class: Clean Contaminated

## 2012-11-19 MED ORDER — HYDROMORPHONE HCL PF 1 MG/ML IJ SOLN
INTRAMUSCULAR | Status: DC | PRN
  Administered 2012-11-19: 0.5 mg via INTRAVENOUS
  Administered 2012-11-19: 1 mg via INTRAVENOUS
  Administered 2012-11-19: 0.5 mg via INTRAVENOUS

## 2012-11-19 MED ORDER — MORPHINE SULFATE 10 MG/ML IJ SOLN
2.0000 mg | INTRAMUSCULAR | Status: DC | PRN
  Administered 2012-11-19 – 2012-11-20 (×3): 4 mg via INTRAVENOUS
  Filled 2012-11-19 (×3): qty 1

## 2012-11-19 MED ORDER — OXYCODONE-ACETAMINOPHEN 5-325 MG/5ML PO SOLN
5.0000 mL | ORAL | Status: DC | PRN
  Administered 2012-11-20: 5 mL via ORAL
  Administered 2012-11-20: 10 mL via ORAL
  Administered 2012-11-20: 5 mL via ORAL
  Administered 2012-11-21 (×2): 10 mL via ORAL
  Filled 2012-11-19 (×4): qty 10

## 2012-11-19 MED ORDER — UNJURY VANILLA POWDER: 2.0000 [oz_av] | Freq: Four times a day (QID) | ORAL | Status: DC

## 2012-11-19 MED ORDER — ACETAMINOPHEN 10 MG/ML IV SOLN
INTRAVENOUS | Status: AC
  Filled 2012-11-19: qty 100

## 2012-11-19 MED ORDER — ACETAMINOPHEN 160 MG/5ML PO SOLN: 650.0000 mg | ORAL | Status: DC | PRN

## 2012-11-19 MED ORDER — HYDROMORPHONE HCL PF 1 MG/ML IJ SOLN
INTRAMUSCULAR | Status: AC
  Filled 2012-11-19: qty 1

## 2012-11-19 MED ORDER — SODIUM CHLORIDE 0.9 % IV SOLN
1.0000 g | INTRAVENOUS | Status: AC
  Administered 2012-11-19: 1 g via INTRAVENOUS

## 2012-11-19 MED ORDER — UNJURY CHICKEN SOUP POWDER: 2.0000 [oz_av] | Freq: Four times a day (QID) | ORAL | Status: DC

## 2012-11-19 MED ORDER — ONDANSETRON HCL 4 MG/2ML IJ SOLN
4.0000 mg | INTRAMUSCULAR | Status: DC | PRN
  Administered 2012-11-19 (×2): 4 mg via INTRAVENOUS
  Filled 2012-11-19 (×2): qty 2

## 2012-11-19 MED ORDER — ACETAMINOPHEN 10 MG/ML IV SOLN
INTRAVENOUS | Status: DC | PRN
  Administered 2012-11-19: 1000 mg via INTRAVENOUS

## 2012-11-19 MED ORDER — SODIUM CHLORIDE 0.9 % IV SOLN
INTRAVENOUS | Status: AC
  Filled 2012-11-19: qty 1

## 2012-11-19 MED ORDER — MORPHINE SULFATE 4 MG/ML IJ SOLN
INTRAMUSCULAR | Status: AC
  Administered 2012-11-19: 4 mg
  Filled 2012-11-19: qty 1

## 2012-11-19 MED ORDER — GLYCOPYRROLATE 0.2 MG/ML IJ SOLN
INTRAMUSCULAR | Status: DC | PRN
  Administered 2012-11-19: 0.6 mg via INTRAVENOUS

## 2012-11-19 MED ORDER — HEPARIN SODIUM (PORCINE) 5000 UNIT/ML IJ SOLN
5000.0000 [IU] | Freq: Once | INTRAMUSCULAR | Status: AC
  Administered 2012-11-19: 5000 [IU] via SUBCUTANEOUS
  Filled 2012-11-19: qty 1

## 2012-11-19 MED ORDER — ENOXAPARIN SODIUM 40 MG/0.4ML ~~LOC~~ SOLN
40.0000 mg | Freq: Two times a day (BID) | SUBCUTANEOUS | Status: DC
  Administered 2012-11-20 – 2012-11-21 (×3): 40 mg via SUBCUTANEOUS
  Filled 2012-11-19 (×5): qty 0.4

## 2012-11-19 MED ORDER — LIDOCAINE-EPINEPHRINE 1 %-1:100000 IJ SOLN
INTRAMUSCULAR | Status: DC | PRN
  Administered 2012-11-19: 15 mL

## 2012-11-19 MED ORDER — ALBUTEROL SULFATE HFA 108 (90 BASE) MCG/ACT IN AERS
2.0000 | INHALATION_SPRAY | RESPIRATORY_TRACT | Status: DC | PRN
  Filled 2012-11-19: qty 6.7

## 2012-11-19 MED ORDER — CISATRACURIUM BESYLATE (PF) 10 MG/5ML IV SOLN
INTRAVENOUS | Status: DC | PRN
  Administered 2012-11-19: 10 mg via INTRAVENOUS
  Administered 2012-11-19: 2 mg via INTRAVENOUS

## 2012-11-19 MED ORDER — ONDANSETRON HCL 4 MG/2ML IJ SOLN
INTRAMUSCULAR | Status: DC | PRN
  Administered 2012-11-19: 2 mg via INTRAVENOUS

## 2012-11-19 MED ORDER — NEOSTIGMINE METHYLSULFATE 1 MG/ML IJ SOLN
INTRAMUSCULAR | Status: DC | PRN
  Administered 2012-11-19: 5 mg via INTRAVENOUS

## 2012-11-19 MED ORDER — THYROID 60 MG PO TABS
90.0000 mg | ORAL_TABLET | Freq: Every day | ORAL | Status: DC
  Administered 2012-11-20 – 2012-11-21 (×2): 90 mg via ORAL
  Filled 2012-11-19 (×3): qty 1

## 2012-11-19 MED ORDER — LACTATED RINGERS IV SOLN
INTRAVENOUS | Status: DC | PRN
  Administered 2012-11-19 (×2): via INTRAVENOUS

## 2012-11-19 MED ORDER — PROMETHAZINE HCL 25 MG/ML IJ SOLN
6.2500 mg | INTRAMUSCULAR | Status: DC | PRN
  Administered 2012-11-19: 6.25 mg via INTRAVENOUS

## 2012-11-19 MED ORDER — LIDOCAINE-EPINEPHRINE 1 %-1:100000 IJ SOLN
INTRAMUSCULAR | Status: AC
  Filled 2012-11-19: qty 1

## 2012-11-19 MED ORDER — BUPIVACAINE HCL 0.25 % IJ SOLN
INTRAMUSCULAR | Status: DC | PRN
  Administered 2012-11-19: 15 mL

## 2012-11-19 MED ORDER — SUCCINYLCHOLINE CHLORIDE 20 MG/ML IJ SOLN
INTRAMUSCULAR | Status: DC | PRN
  Administered 2012-11-19: 150 mg via INTRAVENOUS

## 2012-11-19 MED ORDER — FENTANYL CITRATE 0.05 MG/ML IJ SOLN
INTRAMUSCULAR | Status: DC | PRN
  Administered 2012-11-19 (×2): 25 ug via INTRAVENOUS
  Administered 2012-11-19 (×2): 50 ug via INTRAVENOUS
  Administered 2012-11-19: 25 ug via INTRAVENOUS
  Administered 2012-11-19: 50 ug via INTRAVENOUS
  Administered 2012-11-19: 25 ug via INTRAVENOUS

## 2012-11-19 MED ORDER — KCL IN DEXTROSE-NACL 20-5-0.45 MEQ/L-%-% IV SOLN
INTRAVENOUS | Status: DC
  Administered 2012-11-19 – 2012-11-20 (×3): via INTRAVENOUS
  Filled 2012-11-19 (×4): qty 1000

## 2012-11-19 MED ORDER — ALBUTEROL SULFATE HFA 108 (90 BASE) MCG/ACT IN AERS
INHALATION_SPRAY | RESPIRATORY_TRACT | Status: DC | PRN
  Administered 2012-11-19: 2 via RESPIRATORY_TRACT

## 2012-11-19 MED ORDER — PROMETHAZINE HCL 25 MG/ML IJ SOLN
INTRAMUSCULAR | Status: AC
  Filled 2012-11-19: qty 1

## 2012-11-19 MED ORDER — LACTATED RINGERS IR SOLN
Status: DC | PRN
  Administered 2012-11-19: 1000 mL

## 2012-11-19 MED ORDER — KCL IN DEXTROSE-NACL 20-5-0.45 MEQ/L-%-% IV SOLN
INTRAVENOUS | Status: AC
  Filled 2012-11-19: qty 1000

## 2012-11-19 MED ORDER — TISSEEL VH 10 ML EX KIT
PACK | CUTANEOUS | Status: DC | PRN
  Administered 2012-11-19: 10 mL

## 2012-11-19 MED ORDER — PROPOFOL 10 MG/ML IV EMUL
INTRAVENOUS | Status: DC | PRN
  Administered 2012-11-19: 200 mg via INTRAVENOUS

## 2012-11-19 MED ORDER — TISSEEL VH 10 ML EX KIT
PACK | CUTANEOUS | Status: AC
  Filled 2012-11-19: qty 2

## 2012-11-19 MED ORDER — HYDROMORPHONE HCL PF 1 MG/ML IJ SOLN: 0.2500 mg | INTRAMUSCULAR | Status: DC | PRN

## 2012-11-19 MED ORDER — DEXAMETHASONE SODIUM PHOSPHATE 10 MG/ML IJ SOLN
INTRAMUSCULAR | Status: DC | PRN
  Administered 2012-11-19: 10 mg via INTRAVENOUS

## 2012-11-19 MED ORDER — BUPIVACAINE HCL (PF) 0.25 % IJ SOLN
INTRAMUSCULAR | Status: AC
  Filled 2012-11-19: qty 30

## 2012-11-19 MED ORDER — LIDOCAINE HCL (CARDIAC) 20 MG/ML IV SOLN
INTRAVENOUS | Status: DC | PRN
  Administered 2012-11-19: 20 mg via INTRAVENOUS

## 2012-11-19 MED ORDER — UNJURY CHOCOLATE CLASSIC POWDER
2.0000 [oz_av] | Freq: Four times a day (QID) | ORAL | Status: DC
  Administered 2012-11-21: 2 [oz_av] via ORAL

## 2012-11-19 MED ORDER — MIDAZOLAM HCL 5 MG/5ML IJ SOLN
INTRAMUSCULAR | Status: DC | PRN
  Administered 2012-11-19: 1 mg via INTRAVENOUS

## 2012-11-19 SURGICAL SUPPLY — 50 items
APPLICATOR COTTON TIP 6IN STRL (MISCELLANEOUS) ×6 IMPLANT
APPLIER CLIP ROT 10 11.4 M/L (STAPLE)
CABLE HIGH FREQUENCY MONO STRZ (ELECTRODE) ×3 IMPLANT
CANISTER SUCTION 2500CC (MISCELLANEOUS) ×3 IMPLANT
CHLORAPREP W/TINT 26ML (MISCELLANEOUS) ×6 IMPLANT
CLIP APPLIE ROT 10 11.4 M/L (STAPLE) IMPLANT
CLOTH BEACON ORANGE TIMEOUT ST (SAFETY) ×3 IMPLANT
DERMABOND ADVANCED (GAUZE/BANDAGES/DRESSINGS)
DERMABOND ADVANCED .7 DNX12 (GAUZE/BANDAGES/DRESSINGS) IMPLANT
DEVICE SUTURE ENDOST 10MM (ENDOMECHANICALS) IMPLANT
DRAIN CHANNEL 19F RND (DRAIN) ×3 IMPLANT
DRAPE LAPAROSCOPIC ABDOMINAL (DRAPES) ×3 IMPLANT
DRAPE UTILITY 15X26 (DRAPE) ×6 IMPLANT
ELECT REM PT RETURN 9FT ADLT (ELECTROSURGICAL) ×3
ELECTRODE REM PT RTRN 9FT ADLT (ELECTROSURGICAL) ×2 IMPLANT
EVACUATOR DRAINAGE 10X20 100CC (DRAIN) ×2 IMPLANT
EVACUATOR SILICONE 100CC (DRAIN) ×4 IMPLANT
GLOVE SURG SS PI 7.5 STRL IVOR (GLOVE) ×6 IMPLANT
GOWN STRL NON-REIN LRG LVL3 (GOWN DISPOSABLE) ×3 IMPLANT
GOWN STRL REIN XL XLG (GOWN DISPOSABLE) ×15 IMPLANT
HANDLE STAPLE EGIA 4 XL (STAPLE) ×3 IMPLANT
HOVERMATT SINGLE USE (MISCELLANEOUS) ×3 IMPLANT
KIT BASIN OR (CUSTOM PROCEDURE TRAY) ×3 IMPLANT
MARKER SKIN DUAL TIP RULER LAB (MISCELLANEOUS) ×3 IMPLANT
NEEDLE SPNL 22GX3.5 QUINCKE BK (NEEDLE) ×3 IMPLANT
NS IRRIG 1000ML POUR BTL (IV SOLUTION) ×3 IMPLANT
PENCIL BUTTON HOLSTER BLD 10FT (ELECTRODE) ×3 IMPLANT
POUCH SPECIMEN RETRIEVAL 10MM (ENDOMECHANICALS) IMPLANT
RELOAD EGIA 60 MED/THCK PURPLE (STAPLE) ×9 IMPLANT
RELOAD TRI 2.0 60 XTHK VAS SUL (STAPLE) ×6 IMPLANT
SCISSORS LAP 5X35 DISP (ENDOMECHANICALS) IMPLANT
SEALANT SURGICAL APPL DUAL CAN (MISCELLANEOUS) ×3 IMPLANT
SET IRRIG TUBING LAPAROSCOPIC (IRRIGATION / IRRIGATOR) ×3 IMPLANT
SHEARS CURVED HARMONIC AC 45CM (MISCELLANEOUS) ×3 IMPLANT
SLEEVE ENDOPATH XCEL 5M (ENDOMECHANICALS) ×12 IMPLANT
SOLUTION ANTI FOG 6CC (MISCELLANEOUS) ×3 IMPLANT
SPONGE GAUZE 4X4 12PLY (GAUZE/BANDAGES/DRESSINGS) IMPLANT
SPONGE LAP 18X18 X RAY DECT (DISPOSABLE) ×3 IMPLANT
SUT ETHILON 2 0 PS N (SUTURE) ×3 IMPLANT
SUT MNCRL AB 4-0 PS2 18 (SUTURE) ×6 IMPLANT
SUT VICRYL 0 UR6 27IN ABS (SUTURE) ×3 IMPLANT
SYR 50ML LL SCALE MARK (SYRINGE) ×3 IMPLANT
TOWEL OR NON WOVEN STRL DISP B (DISPOSABLE) ×3 IMPLANT
TRAY FOLEY CATH 14FRSI W/METER (CATHETERS) ×3 IMPLANT
TRAY LAP CHOLE (CUSTOM PROCEDURE TRAY) ×3 IMPLANT
TROCAR BLADELESS 15MM (ENDOMECHANICALS) ×3 IMPLANT
TROCAR BLADELESS OPT 5 100 (ENDOMECHANICALS) ×3 IMPLANT
TUBING CONNECTING 10 (TUBING) ×3 IMPLANT
TUBING ENDO SMARTCAP (MISCELLANEOUS) ×3 IMPLANT
TUBING FILTER THERMOFLATOR (ELECTROSURGICAL) ×3 IMPLANT

## 2012-11-19 NOTE — Brief Op Note (Signed)
11/19/2012  10:02 AM  PATIENT:  [name]  37 y.o. female  PRE-OPERATIVE DIAGNOSIS:  morbid obesity  POST-OPERATIVE DIAGNOSIS:  morbid obesity  PROCEDURE:  Procedure(s) with comments: LAPAROSCOPIC GASTRIC SLEEVE RESECTION (N/A) - laparoscopic sleeve gastrectomy with EGD ESOPHAGOGASTRODUODENOSCOPY (EGD) (N/A)  SURGEON:  Surgeon(s) and Role:    * Lodema Pilot, DO - Primary    * Mariella Saa, MD - Assisting  PHYSICIAN ASSISTANT:   ASSISTANTS: [place]   ANESTHESIA:   general  EBL:  Total I/O In: 1500 [I.V.:1500] Out: 200 [Urine:100; Other:50; Blood:50]  BLOOD ADMINISTERED:none  DRAINS: (67F ) Jackson-Pratt drain(s) with closed bulb suction in the sleeve staple line   LOCAL MEDICATIONS USED:  MARCAINE    and LIDOCAINE   SPECIMEN:  Source of Specimen:  sleeve gastrectomy  DISPOSITION OF SPECIMEN:  PATHOLOGY  COUNTS:  YES  TOURNIQUET:  * No tourniquets in log *  DICTATION: .Other Dictation: Dictation Number [id number]  PLAN OF CARE: Admit to inpatient   PATIENT DISPOSITION:  PACU - hemodynamically stable.   Delay start of Pharmacological VTE agent (>24hrs) due to surgical blood loss or risk of bleeding: no

## 2012-11-19 NOTE — H&P (View-Only) (Signed)
Patient ID: [name], female   DOB: August 24, 1976, 37 y.o.   MRN: [id number]  Chief Complaint  Patient presents with  . Bariatric Pre-op    HPI [name] is a 37 y.o. female.  This patient presents for her preoperative surgery evaluation in preparation forvertical sleeve gastrectomy next week. She has a BMI of 46 with comorbidities of polycystic ovarian syndrome and hypothyroidism and asthma. She is interested in a sleeve gastrectomy and has had her upper GI, labs, and has been cleared by the psychologist and nutritionist. She is on the preoperative diet and has lost about 8-9 pounds HPI  Past Medical History  Diagnosis Date  . Hypothyroidism   . Polycystic ovarian syndrome   . Asthma     has rescue inhaler  . Obesity   . Hyperlipidemia   . Plantar fasciitis, bilateral   . Hypertension     r sept 2013 high readings- no meds  . Anemia     Past Surgical History  Procedure Laterality Date  . Plantar fascia release    . Hysteroscopy w/d&c  06/01/2012    Procedure: DILATATION AND CURETTAGE /HYSTEROSCOPY;  Surgeon: Zelphia Cairo, MD;  Location: [facility];  Service: Gynecology;  Laterality: N/A;  with truclear  . Cervical polypectomy      05/2012  . Planter Left sept 2012  . Planter    . Plantar fascia surgery  05/2011    left foot  . Plantar fascia surgery  04/2011    rt foot    Family History  Problem Relation Age of Onset  . Cancer Maternal Aunt     breast and colon    Social History History  Substance Use Topics  . Smoking status: Former Smoker    Types: Cigarettes    Quit date: 10/29/2012  . Smokeless tobacco: Never Used  . Alcohol Use: No    No Known Allergies  Current Outpatient Prescriptions  Medication Sig Dispense Refill  . metFORMIN (GLUMETZA) 1000 MG (MOD) 24 hr tablet Take 1,000 mg by mouth every evening.       . minocycline (MINOCIN,DYNACIN) 100 MG capsule Take 100 mg by mouth 2 (two) times daily.      Marland Kitchen thyroid (ARMOUR) 90 MG tablet Take 90  mg by mouth daily before breakfast.       . VENTOLIN HFA 108 (90 BASE) MCG/ACT inhaler Inhale 1 puff into the lungs every 4 (four) hours as needed for wheezing or shortness of breath.        No current facility-administered medications for this visit.    Review of Systems Review of Systems All other review of systems negative or noncontributory except as stated in the HPI  Blood pressure 128/76, pulse 67, temperature 97.8 F (36.6 C), temperature source Temporal, resp. rate 14, height 5' 2.5" (1.588 m), weight 253 lb 12.8 oz (115.123 kg), last menstrual period 10/14/2012.  Physical Exam Physical Exam Physical Exam  Nursing note and vitals reviewed. Constitutional: She is oriented to person, place, and time. She appears well-developed and well-nourished. No distress.  HENT:  Head: Normocephalic and atraumatic.  Mouth/Throat: No oropharyngeal exudate.  Eyes: Conjunctivae and EOM are normal. Pupils are equal, round, and reactive to light. Right eye exhibits no discharge. Left eye exhibits no discharge. No scleral icterus.  Neck: Normal range of motion. Neck supple. No tracheal deviation present.  Cardiovascular: Normal rate, regular rhythm, normal heart sounds and intact distal pulses.   Pulmonary/Chest: Effort normal and breath sounds normal. No  stridor. No respiratory distress. She has no wheezes.  Abdominal: Soft. Bowel sounds are normal. She exhibits no distension and no mass. There is no tenderness. There is no rebound and no guarding.  Musculoskeletal: Normal range of motion. She exhibits no edema and no tenderness.  Neurological: She is alert and oriented to person, place, and time.  Skin: Skin is warm and dry. No rash noted. She is not diaphoretic. No erythema. No pallor.  Psychiatric: She has a normal mood and affect. Her behavior is normal. Judgment and thought content normal.    Data Reviewed   Assessment    Morbid obesity with a BMI of 46 and comorbidities of polycystic  ovarian syndrome, hypothyroidism, and asthma Via a long discussion regarding the surgical and nonsurgical weight loss options she does remain interested in a sleeve gastrectomy although she did have several questions and shows some normal apprehension preoperatively. We discussed all of her concerns and the perioperative course including the risks and benefits of the procedure. The risks of infection, bleeding, pain, scarring, weight regain, too little or too much weight loss, vitamin deficiencies and need for lifelong vitamin supplementation, hair loss, need for protein supplementation, leaks, stricture, reflux, food intolerance, need for reoperation and conversion to roux Y gastric bypass, need for open surgery, injury to spleen or surrounding structures, DVT's, PE, and death again discussed with the patient and the patient expressed understanding and desires to proceed with laparoscopic vertical sleeve gastrectomy, possible open, intraoperative endoscopy.     Plan    We will proceed with vertical sleeve gastrectomy is scheduled next week        Verdell Dykman DAVID 11/14/2012, 9:56 AM

## 2012-11-19 NOTE — Progress Notes (Signed)
Doing fine.  HR normal.  Seems to be okay.

## 2012-11-19 NOTE — Transfer of Care (Signed)
Immediate Anesthesia Transfer of Care Note  Patient: [name]  Procedure(s) Performed: Procedure(s) with comments: LAPAROSCOPIC GASTRIC SLEEVE RESECTION (N/A) - laparoscopic sleeve gastrectomy with EGD ESOPHAGOGASTRODUODENOSCOPY (EGD) (N/A)  Patient Location: PACU  Anesthesia Type:General  Level of Consciousness: awake and sedated  Airway & Oxygen Therapy: Patient Spontanous Breathing, Patient connected to face mask oxygen and Patient connected to face mask  Post-op Assessment: Report given to PACU RN and Post -op Vital signs reviewed and stable  Post vital signs: Reviewed and stable  Complications: No apparent anesthesia complications

## 2012-11-19 NOTE — Interval H&P Note (Signed)
History and Physical Interval Note:  11/19/2012 7:32 AM  [name]  has presented today for surgery, with the diagnosis of morbid obesity  The various methods of treatment have been discussed with the patient and family. After consideration of risks, benefits and other options for treatment, the patient has consented to  Procedure(s) with comments: LAPAROSCOPIC GASTRIC SLEEVE RESECTION (N/A) - laparoscopic sleeve gastrectomy with EGD ESOPHAGOGASTRODUODENOSCOPY (EGD) (N/A) as a surgical intervention .  The patient's history has been reviewed, patient examined, no change in status, stable for surgery.  I have reviewed the patient's chart and labs.  Questions were answered to the patient's satisfaction.  She was seen in preop area.  Risks and benefits of sleeve gastrectomy again discussed and all questions answered.  The risks of infection, bleeding, pain, scarring, weight regain, too little or too much weight loss, vitamin deficiencies and need for lifelong vitamin supplementation, hair loss, need for protein supplementation, leaks, stricture, reflux, food intolerance, need for reoperation and conversion to roux Y gastric bypass, need for open surgery, injury to spleen or surrounding structures, DVT's, PE, and death again discussed with the patient and the patient expressed understanding and desires to proceed with laparoscopic vertical sleeve gastrectomy, possible open, intraoperative endoscopy.    Lodema Pilot DAVID

## 2012-11-19 NOTE — Anesthesia Postprocedure Evaluation (Signed)
  Anesthesia Post-op Note  Patient: [name]  Procedure(s) Performed: Procedure(s) (LRB): LAPAROSCOPIC GASTRIC SLEEVE RESECTION (N/A) ESOPHAGOGASTRODUODENOSCOPY (EGD) (N/A)  Patient Location: PACU  Anesthesia Type: General  Level of Consciousness: awake and alert   Airway and Oxygen Therapy: Patient Spontanous Breathing  Post-op Pain: mild  Post-op Assessment: Post-op Vital signs reviewed, Patient's Cardiovascular Status Stable, Respiratory Function Stable, Patent Airway and No signs of Nausea or vomiting  Last Vitals:  Filed Vitals:   11/19/12 1045  BP: 141/74  Pulse: 63  Temp: 36.8 C  Resp: 14    Post-op Vital Signs: stable   Complications: No apparent anesthesia complications

## 2012-11-20 ENCOUNTER — Encounter (HOSPITAL_COMMUNITY): Payer: Self-pay | Admitting: General Surgery

## 2012-11-20 LAB — CBC WITH DIFFERENTIAL/PLATELET
Eosinophils Absolute: 0 10*3/uL (ref 0.0–0.7)
HCT: 38.4 % (ref 36.0–46.0)
Hemoglobin: 12.8 g/dL (ref 12.0–15.0)
Lymphs Abs: 1.8 10*3/uL (ref 0.7–4.0)
MCH: 25.5 pg — ABNORMAL LOW (ref 26.0–34.0)
Monocytes Absolute: 1 10*3/uL (ref 0.1–1.0)
Monocytes Relative: 8 % (ref 3–12)
Neutrophils Relative %: 77 % (ref 43–77)
RBC: 5.02 MIL/uL (ref 3.87–5.11)

## 2012-11-20 LAB — COMPREHENSIVE METABOLIC PANEL
Alkaline Phosphatase: 85 U/L (ref 39–117)
BUN: 6 mg/dL (ref 6–23)
Chloride: 102 mEq/L (ref 96–112)
GFR calc Af Amer: >90 mL/min (ref >90)
Glucose, Bld: 156 mg/dL — ABNORMAL HIGH (ref 70–99)
Potassium: 3.8 mEq/L (ref 3.5–5.1)
Total Bilirubin: 0.3 mg/dL (ref 0.3–1.2)

## 2012-11-20 MED ORDER — KETOROLAC TROMETHAMINE 30 MG/ML IJ SOLN
30.0000 mg | Freq: Four times a day (QID) | INTRAMUSCULAR | Status: DC | PRN
  Administered 2012-11-20: 30 mg via INTRAVENOUS
  Filled 2012-11-20: qty 1

## 2012-11-20 MED ORDER — KCL IN DEXTROSE-NACL 20-5-0.9 MEQ/L-%-% IV SOLN
INTRAVENOUS | Status: DC
  Administered 2012-11-20 – 2012-11-21 (×3): via INTRAVENOUS
  Filled 2012-11-20 (×6): qty 1000

## 2012-11-20 NOTE — Care Management Note (Signed)
    Page 1 of 1   11/20/2012     11:34:32 AM   CARE MANAGEMENT NOTE 11/20/2012  Patient:  [name], [name]   Account Number:  [id number]  Date Initiated:  11/20/2012  Documentation initiated by:  Lorenda Ishihara  Subjective/Objective Assessment:   37 yo female admitted s/p lap gastric sleeve. PTA lived at home with spouse.     Action/Plan:   Home when stable   Anticipated DC Date:  11/22/2012   Anticipated DC Plan:  HOME/SELF CARE      DC Planning Services  CM consult      Choice offered to / List presented to:             Status of service:  Completed, signed off Medicare Important Message given?   (If response is "NO", the following Medicare IM given date fields will be blank) Date Medicare IM given:   Date Additional Medicare IM given:    Discharge Disposition:  HOME/SELF CARE  Per UR Regulation:  Reviewed for med. necessity/level of care/duration of stay  If discussed at Long Length of Stay Meetings, dates discussed:    Comments:

## 2012-11-20 NOTE — Op Note (Signed)
[name], [name]              ACCOUNT NO.:  [id number]  MEDICAL RECORD NO.:  [id number]  LOCATION:  1525                         FACILITY:  [facility]  PHYSICIAN:  Lodema Pilot, MD       DATE OF BIRTH:  10/15/1975  DATE OF PROCEDURE:  11/19/2012 DATE OF DISCHARGE:                              OPERATIVE REPORT   PROCEDURE:  Laparoscopic vertical sleeve gastrectomy with intraoperative upper endoscopy.  PREOPERATIVE DIAGNOSIS:  Obesity.  POSTOPERATIVE DIAGNOSIS:  Obesity.  SURGEON:  Lodema Pilot, MD  ASSISTANT:  Lorne Skeens. Hoxworth, MD  ANESTHESIA:  General endotracheal anesthesia with 30 mL of 1% lidocaine with epinephrine and 0.25% Marcaine in a 50:50 mixture.  FLUIDS:  1500 mL of crystalloid.  ESTIMATED BLOOD LOSS:  50 mL.  DRAINS:  A 19-French Blake drain placed along the sleeve staple line.  SPECIMENS:  Greater curvature of the stomach sent to Pathology for permanent sectioning.  COMPLICATIONS:  No surgical complications.  Her ET tube did come dislodged prior to the start of the procedure, requiring re-intubation by anesthesia.  INDICATION FOR PROCEDURE:  [name] is a 37 year old female with a BMI of 87, who failed medical weight loss attempts and desires durable weight loss solution.  OPERATIVE DETAILS:  [name] was seen and evaluated in the preoperative area and risks and benefits of procedure were again discussed in lay terms.  Informed consent was obtained.  She was given prophylactic antibiotics and subcu heparin and taken to the operating room, placed on the table in supine position.  General endotracheal tube anesthesia was obtained.  Foley catheter was placed.  Her abdomen was prepped and draped in a standard surgical fashion and prior to the start of the procedure.  Before we had draped and while we were waiting for the prep to dry, her ET tube had gone dislodged for a brief period of desaturations requiring re-intubation but her saturation came  up rapidly and it was felt that it would be okay to proceed with her procedure.  So we re-prepped the patient and draped the patient and procedure time-out was performed when all operative team members confirmed correct patient, procedure.  A 5 Optiview trocar was used to access the peritoneum in the left upper quadrant and pneumoperitoneum was obtained.  Laparoscope was introduced and there was no evidence of bleeding or bowel injury upon entry.  A 5-mm left rectus port was placed.  A 5-mm right upper quadrant port and a 15-mm right rectus port were placed under direct visualization.  Nathanson liver retractor was passed through the epigastrium through a separate stab incision.  The left lobe of liver was retracted cephalad.  G-tube was removed after suctioning the stomach.  So all tubes were out except for the endotracheal tube that measured 5 cm from the pylorus and began dividing the short gastric vessels along the greater curvature of the stomach.  This dissection was carried around the greater curvature of the stomach, to the spleen.  The stomach and spleen were very closely adherent in this area at the upper portion of the stomach.  We were able to clear the stomach and separate it from the spleen and off of the left  crus of the diaphragm.  She did not have evidence of any hiatal hernia.  The posterior gastric attachments were divided with sharp dissection, the lesser curvature of the stomach after we were confident that we divided all the posterior attachments and greater curvature attachments of the stomach.  I remeasured 5 cm again from pyloric and began dividing the stomach, creating the sleeve.  First firing was taken towards the angularis, taking care to avoid narrowing of the stomach at this point.  The second staple load was placed on the stomach and 36-French bougie was passed down the mouth and esophagus and along the lesser curvature of the stomach and passed the stapler  and the second fire was taken.  Care was taken to avoid narrowing the stomach at the angularis as I discussed. Then I transitioned from black Tri-Staple loads to purple Tri-Staple loads, carrying the division of the stomach up towards the angle of His, taking care to avoid Christmas tree formation of the sleeve and spiraling of the staple lines for the final firing of the stapler to transect the stomach.  This was taken off the fat pad and we certified that we were not including any stomach with the division and the stomach is completely divided.  The staple line was noted to be hemostatic except for 2 portions along the staple line which were controlled with hemoclips.  The abdomen was filled with saline solution and well lubricated fiberoptic endoscope was passed without difficulty into the esophagus and through the stomach.  The sleeve was insufflated and while it was under water, there was no evidence of any air leakage.  The sleeve was very tubular without any evidence of narrowing or stricture. No evidence of internal bleeding.  The air was suctioned and the scope was removed and Tisseel fibrin glue was placed along the sleeve staple lines and a 19-French Blake drain was placed in the abdomen and exited through the 5 mm left upper quadrant trocar site.  This was sutured in place with nylon drain stitches and the stomach was removed through an enlarged 15-mm port site.  It was sent to Pathology for permanent sectioning.  The fascia was then approximated with interrupted 0 Vicryl sutures in open fashion.  The sutures were secured and the abdomen was re-insufflated with carbon dioxide gas.  The abdominal wall closure was noted to be adequate.  There was no evidence of bleeding or bowel injury and the sleeve appeared to be hemostatic.  The final trocars were removed under direct visualization and the wounds were injected with total of 30 mL of 1% lidocaine with epinephrine, 0.25%  Marcaine in a 50:50 mixture.  The skin was approximated with 4-0 Monocryl subcuticular suture.  Skin was washed and dried, and Dermabond was applied.  Foley catheter was removed and there were no apparent complications.  She was hemodynamically stable and ready for transfer to recovery room in stable condition.          ______________________________ Lodema Pilot, MD     BL/MEDQ  D:  11/19/2012  T:  11/20/2012  Job:  [id number]

## 2012-11-20 NOTE — Progress Notes (Signed)
1 Day Post-Op  Subjective: Doing okay.  Still with some pain issues.  Not nauseated except when walking.  Objective: Vital signs in last 24 hours: Temp:  [97.6 F (36.4 C)-99.4 F (37.4 C)] 99.1 F (37.3 C) (02/25 0558) Pulse Rate:  [60-90] 90 (02/25 0558) Resp:  [13-20] 18 (02/25 0558) BP: (107-146)/(64-93) 126/78 mmHg (02/25 0558) SpO2:  [95 %-100 %] 99 % (02/25 0558) Weight:  [253 lb 12.8 oz (115.123 kg)] 253 lb 12.8 oz (115.123 kg) (02/24 1107) Last BM Date: 11/18/12  Intake/Output from previous day: 02/24 0701 - 02/25 0700 In: 3658.3 [I.V.:3658.3] Out: 3515 [Urine:3250; Drains:165; Blood:50] Intake/Output this shift:    General appearance: alert, cooperative and no distress Resp: nonlabored Cardio: normal rate, regular GI: soft, appropriate incisional tenderness, ND, no pertioneal signs, JP ss Extremities: SCD's bilat  Lab Results:   Recent Labs  11/20/12 0408  WBC 12.5*  HGB 12.8  HCT 38.4  PLT 270   BMET  Recent Labs  11/20/12 0408  NA 134*  K 3.8  CL 102  CO2 24  GLUCOSE 156*  BUN 6  CREATININE 0.58  CALCIUM 8.5   PT/INR No results found for this basename: LABPROT, INR,  in the last 72 hours ABG No results found for this basename: PHART, PCO2, PO2, HCO3,  in the last 72 hours  Studies/Results: No results found.  Anti-infectives: Anti-infectives   Start     Dose/Rate Route Frequency Ordered Stop   11/19/12 0611  ertapenem [facility]) 1 g in sodium chloride 0.9 % 50 mL IVPB     1 g 100 mL/hr over 30 Minutes Intravenous On call to O.R. 11/19/12 2952 11/19/12 0730      Assessment/Plan: s/p Procedure(s) with comments: LAPAROSCOPIC GASTRIC SLEEVE RESECTION (N/A) - laparoscopic sleeve gastrectomy with EGD ESOPHAGOGASTRODUODENOSCOPY (EGD) (N/A) seems to be doing appropriate.  will try to advance diet.  continue to mobilize.  LOS: 1 day    Lodema Pilot DAVID 11/20/2012

## 2012-11-20 NOTE — Progress Notes (Signed)
Feeling better.  Tolerating liquids. No nausea.  HR normal.  Mobilize and liquids as tolerated.

## 2012-11-20 NOTE — Progress Notes (Signed)
Patient is alert and oriented.  VSS.  Patient with moderate abdominal discomfort, RN notified for pain control.  Patient is ambulating in hallways, using incentive spirometry, and wearing compression hose while in bed.  Patient is tolerating ice chips, no nausea or vomiting noted.  Patient denies gas or bowel movement.  Discharge instructions given to patient for review, will go over in detail prior to discharge.    Quenton Fetter, RN

## 2012-11-21 MED ORDER — OXYCODONE-ACETAMINOPHEN 5-325 MG/5ML PO SOLN: 5.0000 mL | ORAL | Status: DC | PRN

## 2012-11-21 MED ORDER — ONDANSETRON 4 MG PO TBDP: 4.0000 mg | ORAL_TABLET | Freq: Three times a day (TID) | ORAL | Status: DC | PRN

## 2012-11-21 NOTE — Progress Notes (Signed)
2 Days Post-Op  Subjective: She continues to feel better.  Pain controlled. No nausea and tolerating liquids.  Objective: Vital signs in last 24 hours: Temp:  [96.3 F (35.7 C)-98.6 F (37 C)] 98.5 F (36.9 C) (02/26 0649) Pulse Rate:  [65-73] 72 (02/26 0649) Resp:  [16-18] 18 (02/26 0649) BP: (108-150)/(70-82) 119/78 mmHg (02/26 0649) SpO2:  [96 %-100 %] 96 % (02/26 0649) Last BM Date: 11/18/12  Intake/Output from previous day: 02/25 0701 - 02/26 0700 In: 2500.4 [I.V.:2500.4] Out: 1810 [Urine:1650; Drains:160] Intake/Output this shift:    General appearance: alert, cooperative and no distress Resp: nonlabored Cardio: normal rate, regular GI: soft, mild incisional tenderness, ND, JP ss, no peritoneal signs Extremities: SCD's bilat. LE  Lab Results:   Recent Labs  11/20/12 0408  WBC 12.5*  HGB 12.8  HCT 38.4  PLT 270   BMET  Recent Labs  11/20/12 0408  NA 134*  K 3.8  CL 102  CO2 24  GLUCOSE 156*  BUN 6  CREATININE 0.58  CALCIUM 8.5   PT/INR No results found for this basename: LABPROT, INR,  in the last 72 hours ABG No results found for this basename: PHART, PCO2, PO2, HCO3,  in the last 72 hours  Studies/Results: No results found.  Anti-infectives: Anti-infectives   Start     Dose/Rate Route Frequency Ordered Stop   11/19/12 0611  ertapenem [facility]) 1 g in sodium chloride 0.9 % 50 mL IVPB     1 g 100 mL/hr over 30 Minutes Intravenous On call to O.R. 11/19/12 [id number] 11/19/12 0730      Assessment/Plan: s/p Procedure(s) with comments: LAPAROSCOPIC GASTRIC SLEEVE RESECTION (N/A) - laparoscopic sleeve gastrectomy with EGD ESOPHAGOGASTRODUODENOSCOPY (EGD) (N/A) she seems to be doing well, no evidence of postop complication, she should be okay for discharge to home . Discharge instructions reviewed with the patient.  LOS: 2 days    Lodema Pilot DAVID 11/21/2012

## 2012-11-21 NOTE — Progress Notes (Signed)
Pt for d/c home today. IV d/c'd. Dermabond to abdominal sites CDI. Tolerarated protein shakes as ordered. D/C instructions & RX given with verbalized understanding. Husband at bedside to assist with d/c Bariatruc RN provided d/c instructions as well.

## 2012-11-21 NOTE — Progress Notes (Signed)
Patient is alert and oriented, up ambulating in the hallway.  Patient continues to use incentive spirometry and wearing compression hose while in bed.  Patient denies passing gas or bowel movement.  Patient is tolerating water and protein shake without nausea or vomiting.  Patient has minimal abdominal pain that is relieved with prn medication.  Discharge instructions listed below discussed with patient and spouse.  Patient verbalized understanding  Dr.Layton notified for vitamin prescriptions, patient will pick up at office.   Patient is aware of support group and BELT program. Patient has follow up appointments with [facility] and [facility].   Leslie Fetter, Rn  GASTRIC BYPASS/SLEEVE DISCHARGE INSTRUCTIONS  Drs. Fredrik Rigger, Hoxworth, Wilson, and Wolford Call if you have any problems.   Call [phone] and ask for the surgeon on call.    If you need immediate assistance come to the ER at [facility]. Tell the ER personnel that you are a new post-op gastric bypass patient. Signs and symptoms to report:   Severe vomiting or nausea. If you cannot tolerate clear liquids for longer than 1 day, you need to call your surgeon.    Abdominal pain which does not get better after taking your pain medication   Fever greater than 101 F degree   Difficulty breathing   Chest pain    Redness, swelling, drainage, or foul odor at incision sites    If your incisions open or pull apart   Swelling or pain in calf (lower leg)   Diarrhea, frequent watery, uncontrolled bowel movements.   Constipation, (no bowel movements for 3 days) if this occurs, Take Milk of Magnesia, 2 tablespoons by mouth, 3 times a day for 2 days if needed.  Call your doctor if constipation continues. Stop taking Milk of Magnesia once you have had a bowel movement. You may also use Miralax according to the label instructions.   Anything you consider "abnormal for you".   Normal side effects after Surgery:   Unable to sleep at night or concentrate    Irritability   Being tearful (crying) or depressed   These are common complaints, possibly related to your anesthesia, stress of surgery and change in lifestyle, that usually go away a few weeks after surgery.  If these feelings continue, call your medical doctor.  Wound Care You may have surgical glue, steri-strips, or staples over your incisions after surgery.  Surgical glue:  Looks like a clear film over your incisions and will wear off gradually. Steri-strips: Strips of tape over your incisions. You may notice a yellowish color on the skin underneath the steri-strips. This is a substance used to make the steri-strips stick better. Do not pull the steri-strips off - let them fall off.  Staples: Cherlynn Polo may be removed before you leave the hospital. If you go home with staples, call [facility] [phone]) for an appointment with your surgeon's nurse to have staples removed in 7 - 10 days. Showering: You may shower two days after your surgery unless otherwise instructed by your surgeon. Wash gently around wounds with warm soapy water, rinse well, and gently pat dry.  If you have a drain, you may need someone to hold this while you shower. Avoid tub baths until staples are removed and incisions are healed.    Medications   Medications should be liquid or crushed if larger than the size of a dime.  Extended release pills should not be crushed.   Depending on the size and number of medications you take,  you may need to stagger/change the time you take your medications so that you do not over-fill your pouch.    Make sure you follow-up with your primary care physician to make medication adjustments needed during rapid weight loss and life-style adjustment.   If you are diabetic, follow up with the doctor that prescribes your diabetes medication(s) within one week after surgery and check your blood sugar regularly.   Do not drive while taking narcotics!   Do not take acetaminophen  (Tylenol) and Roxicet or Lortab Elixir at the same time since these pain medications contain acetaminophen.  Diet at home: (First 2 Weeks) You will see the nutritionist two weeks after your surgery. She will advance your diet if you are tolerating liquids well. Once at home, if you have severe vomiting or nausea and cannot tolerate clear liquids lasting longer than 1 day, call your surgeon.  Begin high protein shake 2 ounces every 3 hours, 5 - 6 times per day.  Gradually increase the amount you drink as tolerated.  You may find it easier to slowly sip shakes throughout the day.  It is important to get your proteins in first.   Protein Shake   Drink at least 2 ounces of shake 5-6 times per day   Each serving of protein shakes should have a minimum of 15 grams of protein and no more than 5 grams of carbohydrate    Increase the amount of protein shake you drink as tolerated   Protein powder may be added to fluids such as non-fat milk or Lactaid milk (limit to 20 grams added protein powder per serving   The initial goal is to drink at least 8 ounces of protein shake/drink per day (or as directed by the nutritionist). Some examples of protein shakes are [employer], [employer], EAS Edge HP, and Unjury. Hydration   Gradually increase the amount of water and other liquids as tolerated (See Acceptable Fluids)   Gradually increase the amount of protein shake as tolerated     Sip fluids slowly and throughout the day   May use Sugar substitutes, use sparingly (limit to 6 - 8 packets per day). Your fluid goal is 64 ounces of fluid daily. It may take a few weeks to build up to this.         32 oz (or more) should be clear liquids and 32 oz (or more) should be full liquids.         Liquids should not contain sugar, caffeine, or carbonation! Acceptable Fluids Clear Liquids:   Water or Sugar-free flavored water, Fruit H2O   Decaffeinated coffee or tea (sugar-free)   Crystal Lite, Wyler's Lite,  Minute Maid Lite   Sugar-free Jell-O   Bouillon or broth   Sugar-free Popsicle:   *Less than 20 calories each; Limit 1 per day   Full Liquids:              Protein Shakes/Drinks + 2 choices per day of other full liquids shown below.    Other full liquids must be: No more than 12 grams of Carbs per serving,  No more than 3 grams of Fat per serving   Strained low-fat cream soup   Non-Fat milk   Fat-free Lactaid Milk   Sugar-free yogurt (Dannon Lite & Fit) Vitamins and Minerals (Start 1 day after surgery unless otherwise directed)   2 Chewable Multivitamin / Multimineral Supplement (i.e. Centrum for Adults)   Chewable Calcium Citrate with Vitamin D-3. Take 1500  mg each day.           (Example: 3 Chewable Calcium Plus 600 with Vitamin D-3 can be found at [facility])         Vitamin B-12, 350 - 500 micrograms (oral tablet) each day   Do not mix multivitamins containing iron with calcium supplements; take 2 hours   apart   Do not substitute Tums (calcium carbonate) for your calcium   Menstruating women and those at risk for anemia may need extra iron. Talk with your doctor to see if you need additional iron.    If you need extra iron:  Total daily Iron recommendations (including Vitamins) = 50 - 100 mg Iron/day Do not stop taking or change any vitamins or minerals until you talk to your nutritionist or surgeon. Your nutritionist and / or physician must approve all vitamin and mineral supplements. Exercise For maximum success, begin exercising as soon as your doctor recommends. Make sure your physician approves any physical activity.   Depending on fitness level, begin with a simple walking program   Walk 5-15 minutes each day, 7 days per week.    Slowly increase until you are walking 30-45 minutes per day   Consider joining our BELT program. [phone] or email [email or web address] .edu Things to remember:    You may have sexual relations when you feel comfortable. It is VERY important for female patients  to use a reliable birth control method. Fertility often increases after surgery. Do not get pregnant for at least 18 months.   It is very important to keep all follow up appointments with your surgeon, nutritionist, primary care physician, and behavioral health practitioner. After the first year, please follow up with your bariatric surgeon at least once a year in order to maintain best weight loss results.  [facility]: [phone] [facility]: [phone]   Free counseling is available for you and your family through collaboration between [facility] and [place]. Please call [phone] and leave a message.    Consider purchasing a medical alert bracelet that says you had gastric bypass surgery.    The [facility] has a free Bariatric Surgery Support Group that meets monthly, the 3rd Thursday, 6 pm, Classroom #1, [place]. You may register online at [email or web address], but registration is not necessary. Select Classes and Support Groups, Bariatric Surgery, or Call [phone]   Do not return to work or drive until cleared by your surgeon   Use your CPAP when sleeping if applicable   Do not lift anything greater than ten pounds for at least two weeks

## 2012-11-21 NOTE — Discharge Summary (Signed)
  Physician Discharge Summary  Patient ID: [name] MRN: [id number] DOB/AGE: [birth year] 37 y.o.  Admit date: 11/19/2012 Discharge date: 11/21/2012  Admission Diagnoses: obesity  Discharge Diagnoses: obesity Active Problems:   * No active hospital problems. *   Discharged Condition: stable  Hospital Course: to OR 11/19/12 for lap sleeve gastrectomy.  Diet advanced on POD 1. She was ambulatory and tolerating diet and stable for discharge on POD 2  Consults: None  Significant Diagnostic Studies: none  Treatments: surgery: 11/19/12 sleeve gastrectomy  Disposition: 01-Home or Self Care  Discharge Orders   Future Appointments Provider Department Dept Phone   12/04/2012 4:00 PM [facility] Post-Op Class [facility] [phone]   12/07/2012 9:00 AM Lodema Pilot, DO [facility], [place] [phone]   Future Orders Complete By Expires     Call MD for:  difficulty breathing, headache or visual disturbances  As directed     Call MD for:  persistant dizziness or light-headedness  As directed     Call MD for:  persistant nausea and vomiting  As directed     Call MD for:  redness, tenderness, or signs of infection (pain, swelling, redness, odor or green/yellow discharge around incision site)  As directed     Call MD for:  severe uncontrolled pain  As directed     Call MD for:  temperature >100.4  As directed     Discharge instructions  As directed     Comments:      May shower tomorrow.  Cover drain site with clean gauze as needed if draining. Call [phone] for follow up appointment in 3 weeks with Dr. Biagio Quint Increase activity as tolerated. Full liquid diet x1 week, then pureed diet x1 week, then soft diet x1 week then slowly advance to high protein, low carbohydrate, low fat diet. Start protein supplements and multivitamins. Crush all medications or use liquid medications for 4 weeks.    Increase activity slowly  As directed          Medication List    STOP taking these medications       metFORMIN 1000 MG (MOD) 24 hr tablet  Commonly known as:  GLUMETZA      TAKE these medications       minocycline 100 MG capsule  Commonly known as:  MINOCIN,DYNACIN  Take 100 mg by mouth 2 (two) times daily.     ondansetron 4 MG disintegrating tablet  Commonly known as:  ZOFRAN ODT  Take 1 tablet (4 mg total) by mouth every 8 (eight) hours as needed for nausea.     oxyCODONE-acetaminophen 5-325 MG/5ML solution  Commonly known as:  ROXICET  Take 5-10 mLs by mouth every 4 (four) hours as needed.     thyroid 90 MG tablet  Commonly known as:  ARMOUR  Take 90 mg by mouth daily before breakfast.     VENTOLIN HFA 108 (90 BASE) MCG/ACT inhaler  Generic drug:  albuterol  Inhale 1 puff into the lungs every 4 (four) hours as needed for wheezing or shortness of breath.         SignedLodema Pilot DAVID 11/21/2012, 7:33 AM

## 2012-12-04 ENCOUNTER — Encounter: Payer: 59 | Attending: General Surgery | Admitting: *Deleted

## 2012-12-04 ENCOUNTER — Ambulatory Visit (INDEPENDENT_AMBULATORY_CARE_PROVIDER_SITE_OTHER): Payer: 59 | Admitting: General Surgery

## 2012-12-04 ENCOUNTER — Encounter (INDEPENDENT_AMBULATORY_CARE_PROVIDER_SITE_OTHER): Payer: Self-pay | Admitting: General Surgery

## 2012-12-04 VITALS — BP 126/74 | HR 71 | Temp 97.2°F | Resp 16 | Ht 62.5 in | Wt 237.6 lb

## 2012-12-04 DIAGNOSIS — Z4889 Encounter for other specified surgical aftercare: Secondary | ICD-10-CM

## 2012-12-04 DIAGNOSIS — Z713 Dietary counseling and surveillance: Secondary | ICD-10-CM | POA: Insufficient documentation

## 2012-12-04 DIAGNOSIS — Z5189 Encounter for other specified aftercare: Secondary | ICD-10-CM

## 2012-12-04 DIAGNOSIS — Z01818 Encounter for other preprocedural examination: Secondary | ICD-10-CM | POA: Insufficient documentation

## 2012-12-04 NOTE — Progress Notes (Signed)
Subjective:     Patient ID: [name], female   DOB: 1975/11/07, 37 y.o.   MRN: [id number]  HPI She follows up 2 weeks s/p lap sleeve gastrectomy.  She is doing well and has lost 16lbs.  She has no food intolerances and has not had any nausea or vomiting.  She is taking good liquids but her only complaint is that she doesn't seem to be urinating as much.  She has not taken any pain meds and denies any abdominal pain or fevers. She also has not been taking her vitamins or protein supplements reliably.  Review of Systems     Objective:   Physical Exam She is in no acute distress and nontoxic-appearing Her abdomen is soft and nontender on exam her incisions are healing well without sign of infection.    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 She doesn't appear to have any postoperative complications. She has no food intolerances. Her bowels are functioning normally. Her only complaint is that she is not urinating as frequently as she feels that she should and so we will go ahead and check a creatinine level today to ensure that she is staying hydrated. Otherwise I recommended that she continue with her physical activity will see her back in about one month for repeat evaluation and we will check some nutrition labs at that time     Plan:     BMP today She will follow up in one month for repeat evaluation

## 2012-12-05 NOTE — Patient Instructions (Signed)
Patient to follow Phase 3A-Soft, High Protein Diet and follow-up at [facility] in 6 weeks for 2 months post-op nutrition visit for diet advancement. 

## 2012-12-05 NOTE — Progress Notes (Signed)
Bariatric Class:  Appt start time: 1600   End time:  1700.  2 Week Post-Operative Nutrition Class  Patient was seen on 12/04/12 for Post-[facility]ative [facility].   Surgery date: 11/19/12  Surgery type: Sleeve  Start weight at [facility]: 261.5 lbs (10/05/12)  Pre-Op Class: 262.2 lbs (11/01/12)  Weight today: 237.0 lbs Weight change: 25.2 lbs Total weight lost: 25.2 lbs  Goal weight: 135 lbs  % goal met: 20%  TANITA  BODY COMP RESULTS  12/04/12   BMI (kg/m^2) 42.7   Fat Mass (lbs) 113.5   Fat Free Mass (lbs) 123.5   Total Body Water (lbs) 90.5   The following the learning objectives were met by the patient during this course:  Identifies Phase 3A (Soft, High Proteins) Dietary Goals and will begin from 2 weeks post-operatively to 2 months post-operatively  Identifies appropriate sources of fluids and proteins   States protein recommendations and appropriate sources post-operatively  Identifies the need for appropriate texture modifications, mastication, and bite sizes when consuming solids  Identifies appropriate multivitamin and calcium sources post-operatively  Describes the need for physical activity post-operatively and will follow MD recommendations  States when to call healthcare provider regarding medication questions or post-operative complications  Handouts given during class include:  Phase 3A: Soft, High Protein Diet Handout  Samples given during class include:   Celebrate Vitamins Iron + C (30 mg): 2 ea Lot: [id number]; Exp: 09/15   Celebrate Vitamins Iron + C (60 mg): 3 ea Lot: [id number]; Exp: 08/14   Dyke Brackett MVI: 2 bottles (5 tabs/bottle) Lot: [id number]; Exp: 03/17   Unjury Protein Powder Lot: [id number]; Exp: 06/15 - 2 pkts Lot: [id number]; 04/15 - 3 pkts   Premier Protein shake: 1 ea Lot: [id number]; Exp: 10/08/13  Follow-Up Plan: Patient will follow-up at [facility] in 6 weeks for 2 months post-op nutrition visit  for diet advancement per MD.

## 2012-12-07 ENCOUNTER — Ambulatory Visit (INDEPENDENT_AMBULATORY_CARE_PROVIDER_SITE_OTHER): Payer: 59 | Admitting: General Surgery

## 2012-12-07 ENCOUNTER — Telehealth (INDEPENDENT_AMBULATORY_CARE_PROVIDER_SITE_OTHER): Payer: Self-pay | Admitting: General Surgery

## 2012-12-07 NOTE — Telephone Encounter (Signed)
Pt called to ask if okay to take OTC meds for an URI with drainage, coughing and sore throat.  Instructed pt to avoid ASA and NSAIDs ( ibuprofen and naproxyn.)  She understands.

## 2012-12-12 LAB — BASIC METABOLIC PANEL
Calcium: 9.6 mg/dL (ref 8.4–10.5)
Sodium: 139 mEq/L (ref 135–145)

## 2013-01-03 ENCOUNTER — Ambulatory Visit (INDEPENDENT_AMBULATORY_CARE_PROVIDER_SITE_OTHER): Payer: 59 | Admitting: General Surgery

## 2013-01-03 VITALS — BP 100/64 | HR 72 | Resp 16 | Ht 62.5 in | Wt 225.8 lb

## 2013-01-03 DIAGNOSIS — Z4889 Encounter for other specified surgical aftercare: Secondary | ICD-10-CM

## 2013-01-03 DIAGNOSIS — Z09 Encounter for follow-up examination after completed treatment for conditions other than malignant neoplasm: Secondary | ICD-10-CM

## 2013-01-03 DIAGNOSIS — K912 Postsurgical malabsorption, not elsewhere classified: Secondary | ICD-10-CM

## 2013-01-03 DIAGNOSIS — Z5189 Encounter for other specified aftercare: Secondary | ICD-10-CM

## 2013-01-03 NOTE — Progress Notes (Signed)
Subjective:     Patient ID: [name], female   DOB: 04-Feb-1976, 37 y.o.   MRN: [id number]  HPI  This patient follows up 7 weeks status post vertical sleeve gastrectomy. She has lost about 20 pounds since her surgery in more than 30 pounds since starting her preoperative diet. She says that she doesn't feel that she is eating enough. She is taking about 30 g and protein shakes per day and supplementing her protein intake with mainly seafood. She does have some difficulty with chicken and beef and she feels that it has difficulty going down her stomach. She has not been eating vegetables. She had one episode of vomiting after eating her coffee shake.  She says that she is walking the dog of 30 minutes a day. Says her bowels are okay. Review of Systems     Objective:   Physical Exam No distress and nontoxic-appearing Her abdomen is soft nontender and exam her incisions are healing well without sign of infection    Assessment:     Status post vertical sleeve gastrectomy-doing well She is doing well without any evidence of any postoperative complications. I. Would like to see her lose a little bit more weight and we talked about dietary changes and activity changes in order to supplement her weight loss. Recommend that she increase her physical activity and she can go ahead and increase to vegetables and fruits as needed. Recommend that she work on her hydration as well and we will go ahead and check a BMP to ensure that her electrolytes are okay.     Plan:     We will check some basic labs today to insure that she is staying hydrated and she will gradually increase her physical activity. We will also check some thyroid labs at her request to see if she Grimes to follow up with her endocrinologist for any adjustments. Overall she feels well and she will continue with her postoperative compared to diet and she will followup with me again in 2 months.

## 2013-01-14 ENCOUNTER — Encounter: Payer: 59 | Attending: General Surgery | Admitting: *Deleted

## 2013-01-14 ENCOUNTER — Encounter: Payer: Self-pay | Admitting: *Deleted

## 2013-01-14 DIAGNOSIS — Z01818 Encounter for other preprocedural examination: Secondary | ICD-10-CM | POA: Insufficient documentation

## 2013-01-14 DIAGNOSIS — Z713 Dietary counseling and surveillance: Secondary | ICD-10-CM | POA: Insufficient documentation

## 2013-01-14 LAB — BASIC METABOLIC PANEL
CO2: 25 mEq/L (ref 19–32)
Chloride: 108 mEq/L (ref 96–112)
Potassium: 4.1 mEq/L (ref 3.5–5.3)
Sodium: 141 mEq/L (ref 135–145)

## 2013-01-14 NOTE — Progress Notes (Addendum)
Follow-up visit:  8 Weeks Post-Operative Gastric Sleeve Surgery  Medical Nutrition Therapy:  Appt start time: 0830   End time:  0900.  Primary concerns today: Post-operative Bariatric Surgery Nutrition Management. [name] returns today for 8 week f/u concerned over lack of wt loss over last 3 weeks. Discussed the "lull" the body can go through after surgery and encouraged her to continue with the nutrition plan. Reports inability to tolerate chicken/red meat as it makes her feel "bad". Likely d/t eating too quickly after taking 2 hrs to prepare dinner meal. Discussed adding small snack on the way home from work. Decreased fluid intake noted, though no constipation reported. Urged to increase.   Surgery date: 11/19/12  Surgery type: Sleeve  Start weight at [facility]: 261.5 lbs (10/05/12)  Pre-Op Class: 262.2 lbs (11/01/12)  Weight today: 225.5 lbs Weight change: 11.5 lbs Total weight lost: 36.7 lbs  Goal weight: 135 lbs  % goal met: 29%  TANITA  BODY COMP RESULTS  12/04/12 01/14/13   BMI (kg/m^2) 42.7 40.6   Fat Mass (lbs) 113.5 108.0   Fat Free Mass (lbs) 123.5 117.5   Total Body Water (lbs) 90.5 86.0   Fluid intake:  32 oz; discussed increasing Estimated total protein intake: ~ 60 g  Medications:  No longer using albuterol inhaler Supplementation: Taking only 1 MVI; Has Freedavite calcium which she states is too large. Takes [phone] mg/day.   Using straws: No Drinking while eating: No Hair loss: No Carbonated beverages:  "Sips here and there"; cannot tolerate full soda N/V/D/C: Not tolerating meat and chicken well d/t eating too quickly; Seafood ok, but eats too fast Dumping syndrome: No  Recent physical activity:  None last week d/t out of town guests; Usually does pilates T/Th; Gym 1 day/week with husband - treadmill/light weights  Progress Towards Goal(s):  In progress.  Handouts given during visit include:  Phase 3B: High Protein + Non-Starchy Vegetables  Samples give during  visit include:   Unjury Protein Powder Lot: [id number]; Exp: 06/15 - 2 pkts Lot: [id number]; Exp: 06/15 - 2 pkts   Nutritional Diagnosis:  [place]-3.3 Overweight/obesity related to past poor dietary habits and physical inactivity as evidenced by patient w/ recent gastric sleeve surgery following dietary guidelines for continued weight loss.    Intervention:  Nutrition education/diet advancement.  Monitoring/Evaluation:  Dietary intake, exercise, lap band fills, and body weight. Follow up in 1 months for 3 month post-op visit.

## 2013-01-14 NOTE — Patient Instructions (Addendum)
Goals:  Follow Phase 3B: High Protein + Non-Starchy Vegetables  Increase lean protein foods to meet 60-80g goal  Increase fluid intake to 64oz +  Avoid drinking 15 minutes before, during and 30 minutes after eating  Aim for >30 min of physical activity daily  Have small snack on way home from work to keep from eating too quickly at dinner  Track food and fluid intake

## 2013-02-11 ENCOUNTER — Encounter: Payer: Self-pay | Admitting: *Deleted

## 2013-02-11 ENCOUNTER — Encounter: Payer: 59 | Attending: General Surgery | Admitting: *Deleted

## 2013-02-11 DIAGNOSIS — Z713 Dietary counseling and surveillance: Secondary | ICD-10-CM | POA: Insufficient documentation

## 2013-02-11 DIAGNOSIS — Z01818 Encounter for other preprocedural examination: Secondary | ICD-10-CM | POA: Insufficient documentation

## 2013-02-11 NOTE — Progress Notes (Signed)
Follow-up visit:  12 Weeks Post-Operative Gastric Sleeve Surgery  Medical Nutrition Therapy:  Appt start time: 0845   End time:  0930.  Primary concerns today: Post-operative Bariatric Surgery Nutrition Management. [name] returns today for 12 wk f/u and continues to be discouraged about her slow weight loss. Food recall shows some excessive CHO intake at times. Reports she was getting Herbalife shakes MWF mornings until she found out they were made of soy protein.  She also c/o increasing fatigue, possibly d/t low energy intake and/or need to titrate hypothyroid med. Has not been able to tolerate meat until recently; may be low iron. Reports eating fruit for snacks and fried fish (though taking off skin after cooking). Has increased exercise quite a bit, though still struggles with fluid intake. States she wants to return to the 2 week post-op diet. Urged her to continue with PO intake for next month until she meets with her endo and Dr. Biagio Grimes.    Surgery date: 11/19/12  Surgery type: Sleeve  Start weight at [facility]: 261.5 lbs (10/05/12)  Pre-Op Class: 262.2 lbs (11/01/12)  Weight today: 217.0 lbs Weight change: 8.5 lbs Total weight lost: 45.2 lbs  Goal weight: 135 lbs  % goal met: 35%  TANITA  BODY COMP RESULTS  12/04/12 01/14/13 02/11/13   BMI (kg/m^2) 42.7 40.6 39.1   Fat Mass (lbs) 113.5 108.0 102.0   Fat Free Mass (lbs) 123.5 117.5 115.0   Total Body Water (lbs) 90.5 86.0 84.0   24 hr Food Recall B (9-11 AM): Premier Protein shake (30g) OR 1 chicken sausage and 2 boiled eggs (35g) EATS/DRINKS OVER MORNING S: NONE L (12 PM):  3/4 cup strawberries or pineapple, 2 oz cheese (10-14g) S (3 PM): NONE or Beef and bean burrito (inside only - no tortilla) - 20g D:  4 oz fried fish w/ skin removed (30g) OR 6-7 medium shrimp (7g) S:  2-3 shrimp (3-5g)  Fluid intake:  32 oz water w/ mio; 11 oz shake, 12 oz hot tea = ~ 55-60 oz  discussed increasing Estimated total protein intake:  70-90  g  Medications:  Has not been taking Metformin since surgery. Has appt with endo soon.  Supplementation: Taking     Using straws: No Drinking while eating: No Hair loss: No Carbonated beverages:  "Sips here and there"; cannot tolerate full soda N/V/D/C: Not tolerating chicken. Seafood and meat is now ok Dumping syndrome: None  Recent physical activity:  MWF @ BELT program; MW nights and Sat @ gym with husband - treadmill/light weights  Progress Towards Goal(s):  In progress.    Nutritional Diagnosis:  [place]-3.3 Overweight/obesity related to past poor dietary habits and physical inactivity as evidenced by patient w/ recent gastric sleeve surgery following dietary guidelines for continued weight loss.    Intervention:  Nutrition education/diet advancement.  Monitoring/Evaluation:  Dietary intake, exercise, and body weight. Follow up in 6 weeks for 4-5 month post-op visit.

## 2013-02-11 NOTE — Patient Instructions (Addendum)
Goals:  Follow Phase 3B: High Protein + Non-Starchy Vegetables until next visit  If you wish to try the Pre-Op diet for 2 weeks, contact me first.   Eat 3-6 small meals/snacks, every 3-5 hrs  Increase fluid intake to 64oz +  May add up to 15 grams of carbohydrate (fruit, whole grain) with meals  Always have protein with carbs to slow metabolism  Avoid drinking 15 minutes before, during and 30 minutes after eating  Aim for >30 min of physical activity daily  Have a snack before working out that includes carbs and protein  Try [employer] or [employer] protein bars

## 2013-03-07 ENCOUNTER — Ambulatory Visit (INDEPENDENT_AMBULATORY_CARE_PROVIDER_SITE_OTHER): Payer: 59 | Admitting: General Surgery

## 2013-03-21 ENCOUNTER — Ambulatory Visit (INDEPENDENT_AMBULATORY_CARE_PROVIDER_SITE_OTHER): Payer: 59 | Admitting: General Surgery

## 2013-03-26 ENCOUNTER — Ambulatory Visit: Payer: 59 | Admitting: *Deleted

## 2013-04-09 ENCOUNTER — Telehealth (INDEPENDENT_AMBULATORY_CARE_PROVIDER_SITE_OTHER): Payer: Self-pay

## 2013-04-09 NOTE — Telephone Encounter (Signed)
Message copied by Maryan Puls on Tue Apr 09, 2013 11:37 AM ------      Message from: Ancil Boozer B      Created: Mon Apr 08, 2013  1:23 PM      Regarding: WORK IN [place]      Contact: [phone]       Azucena Kuba:      (This pt has a past history of canceling appointments as follows:      March 14, June 12 & June 26).            Pt called this morning, stating that she was having some issues.  She stated that she did not feel like she could wait till her appointment scheduled for 8/20. I CXL this appointment & subsequently tried to r/s for an earlier date....Marland Kitchennothing is available until 8/7. Pt states she can't wait this long.            Please work her into schedule or advise me otherwise.            Lawanda Cousins ------

## 2013-04-09 NOTE — Telephone Encounter (Signed)
Patient calling into office with concerns and requested to be seen.  Dr. Delice Lesch not available this week, patient scheduled one of our Bariatric Surgeons on Wed 04/10/13 @ 3:15 pm for further evaluation.

## 2013-04-09 NOTE — Telephone Encounter (Signed)
Patient states she is having nausea and vomiting asking to see Dr. Biagio Quint advised her Dr . Biagio Quint is not available, I scheduled her with Dr. Johna Sheriff for 04/10/13 @ 3:15 advised her to please try  to make this appointment.

## 2013-04-10 ENCOUNTER — Encounter (INDEPENDENT_AMBULATORY_CARE_PROVIDER_SITE_OTHER): Payer: Self-pay | Admitting: General Surgery

## 2013-04-10 ENCOUNTER — Ambulatory Visit (INDEPENDENT_AMBULATORY_CARE_PROVIDER_SITE_OTHER): Payer: 59 | Admitting: General Surgery

## 2013-04-10 VITALS — BP 130/84 | HR 78 | Resp 14 | Ht 62.5 in | Wt 200.8 lb

## 2013-04-10 DIAGNOSIS — Z9884 Bariatric surgery status: Secondary | ICD-10-CM | POA: Insufficient documentation

## 2013-04-10 DIAGNOSIS — R1013 Epigastric pain: Secondary | ICD-10-CM

## 2013-04-10 NOTE — Patient Instructions (Signed)
Concentrate on eating extremely slowly with small bites shoed very well and pause between bites.  Taking your vitamin and mineral supplements every day. Stop rinking fluids 1/2 hour before and one hour after eating

## 2013-04-10 NOTE — Progress Notes (Signed)
Chief complaint: Vomiting post sleeve gastrectomy  History: Patient returns for followup with a history of laparoscopic sleeve gastrectomy 11/19/2012.  She has had some difficulty with oral intake since surgery but particularly over the last month. In recent weeks she has been fasting for Ramadan. Then when she tries to eat she will get pain in her epigastric area up into her chest which is relieved by gagging and vomiting. No nausea. No hematemesis. She has no pain or GI complaints when she does not try to eat. On questioning she may be trying to eat a little fast or large bites because she is still hungry when she starts eating.  Exam: BP 130/84  Pulse 78  Resp 14  Ht 5' 2.5" (1.588 m)  Wt 200 lb 12.8 oz (91.082 kg)  BMI 36.12 kg/m2 Total weight loss 54 pounds since surgery, 26 pounds since last visit in April General:  Does not appear ill Skin: Normal turgor without infection Abdomen: Soft and nontender. No hernias. Wounds well healed.  Assessment and plan: Food intolerance sleeve gastrectomy. It may be that she is just trying to eat too rapidly and 2 large bites. She is able to tolerate fluids well but just cannot drink protein shakes anymore due to taste. We will go ahead and get an upper GI series to rule out stricture. We spent a good deal of time discussing eating strategy post sleeve as detailed in her instructions. We will call her with the results of her x-ray and she will return for followup in one month.

## 2013-04-11 ENCOUNTER — Telehealth (INDEPENDENT_AMBULATORY_CARE_PROVIDER_SITE_OTHER): Payer: Self-pay | Admitting: General Surgery

## 2013-04-11 NOTE — Telephone Encounter (Signed)
LMOM for pt letting her know that he UGI has been scheduled on 04/18/13 at 9:15.  Informed her on the message that she must be NPO after midnight for the procedure and to call back with any further questions.

## 2013-04-18 ENCOUNTER — Other Ambulatory Visit: Payer: 59

## 2013-04-29 ENCOUNTER — Ambulatory Visit
Admission: RE | Admit: 2013-04-29 | Discharge: 2013-04-29 | Disposition: A | Discharge status: Home / Self Care | Payer: 59 | Source: Ambulatory Visit | Attending: General Surgery | Admitting: General Surgery

## 2013-04-29 ENCOUNTER — Telehealth (INDEPENDENT_AMBULATORY_CARE_PROVIDER_SITE_OTHER): Payer: Self-pay

## 2013-04-29 DIAGNOSIS — Z9884 Bariatric surgery status: Secondary | ICD-10-CM

## 2013-04-29 NOTE — Telephone Encounter (Signed)
Called patient with results of X ray (shows some reflux but no major problems) advised patient to keep appointment with Dr. Biagio Quint on 05/15/13.  Patient verbalized understanding.

## 2013-04-29 NOTE — Telephone Encounter (Signed)
Message copied by Maryan Puls on Mon Apr 29, 2013  4:18 PM ------      Message from: Glenna Fellows T      Created: Mon Apr 29, 2013  1:40 PM       Please call patient "X ray shows some reflux but no major problems".  I assume she has F/U with Dr Biagio Quint ------

## 2013-05-14 ENCOUNTER — Encounter: Payer: Self-pay | Admitting: *Deleted

## 2013-05-14 ENCOUNTER — Encounter: Payer: 59 | Attending: General Surgery | Admitting: *Deleted

## 2013-05-14 DIAGNOSIS — Z9884 Bariatric surgery status: Secondary | ICD-10-CM | POA: Insufficient documentation

## 2013-05-14 DIAGNOSIS — Z09 Encounter for follow-up examination after completed treatment for conditions other than malignant neoplasm: Secondary | ICD-10-CM | POA: Insufficient documentation

## 2013-05-14 DIAGNOSIS — Z713 Dietary counseling and surveillance: Secondary | ICD-10-CM | POA: Insufficient documentation

## 2013-05-14 NOTE — Patient Instructions (Addendum)
Goals:  Increase fluid intake to 64oz +  Increase protein intake to 60g daily  May add up to 15 grams of carbohydrate (fruit, whole grain) with meals  Always have protein with carbs to slow metabolism  Avoid drinking 15 minutes before, during and 30 minutes after eating  Aim for >30 min of physical activity daily  Have a snack before working out that includes carbs and protein  Try [employer] or [employer] protein bars

## 2013-05-14 NOTE — Progress Notes (Signed)
Follow-up visit:  6 Months Post-Operative Gastric Sleeve Surgery  Medical Nutrition Therapy:  Appt start time: 0815   End time:  0850.  Primary concerns today: Post-operative Bariatric Surgery Nutrition Management. [name] returns for 6 mo f/u. Reports recent issues with N/V during Ramadan (June 28 - July 28) when she was unable to eat or drink from 4a - 8:45p daily. When able to eat, likely ate too quickly and reports drinking with meals. Had UGI on 04/29/13 which showed no major problems. States issue has resolved now, though only consuming ~10 oz water and 2 cups regular coffee daily. Some excessive fat intake via almonds/pistachios noted, though does not appear to be problematic at this time.   Surgery date: 11/19/12  Surgery type: Sleeve  [facility] weight at [facility]: 261.5 lbs (10/05/12)  Pre-Op Class: 262.2 lbs (11/01/12)  Weight today: 195.5 lbs Weight change: 21.5 lbs Total weight lost: 66.7 lbs  Goal weight: 135 lbs  % goal met: 52%  TANITA  BODY COMP RESULTS  12/04/12 01/14/13 02/11/13 05/14/13   BMI (kg/m^2) 42.7 40.6 39.1 35.2   Fat Mass (lbs) 113.5 108.0 102.0 83.5   Fat Free Mass (lbs) 123.5 117.5 115.0 112.0   Total Body Water (lbs) 90.5 86.0 84.0 82.0   24 hr Food Recall B (9-11 AM):  1 egg omlette w/ mushrooms, pepper, cheese; WAITS 10 min, then has reg coffee (8-10g) S:  2 oz almonds or fruit w/ cheese (12g) L (1:30 PM):  3 oz hamburger (25g) S (5 PM):  2 oz Pistachios or almonds (12-24g) D (10 PM):  3 oz grilled ribs w/ onions, peppers, cauliflower, brussel sprouts (25g) S:  NONE  Fluid intake:  8-10 oz water (since beg of Ramadan) = 8-10 oz  discussed increasing Estimated total protein intake:  80-85 g  Medications:  No changes Supplementation: Taking     Using straws: No Drinking while eating:  Yes; sips with meals  Hair loss:  Yes; resolving now Carbonated beverages:  "Sips here and there"; cannot tolerate full soda N/V/D/C: Not tolerating rice, loaf bread Dumping  syndrome: None  Recent physical activity:  No exercise from end of May through last week. Resumed BELT program and started back at gym recently; does treadmill/light weights  Progress Towards Goal(s):  In progress.    Nutritional Diagnosis:  [place]-3.3 Overweight/obesity related to past poor dietary habits and physical inactivity as evidenced by patient w/ recent gastric sleeve surgery following dietary guidelines for continued weight loss.  [place]-3.1 Inadequate fluid intake related to Ramadan fasting as evidenced by patient reported intake of 8-10 oz fluid daily (12-15%) of post-op needs. - NEW    Intervention:  Nutrition education/reinforcement  Monitoring/Evaluation:  Dietary intake, exercise, and body weight. Follow up in 3 months for 9 month post-op visit.

## 2013-05-15 ENCOUNTER — Ambulatory Visit (INDEPENDENT_AMBULATORY_CARE_PROVIDER_SITE_OTHER): Payer: 59 | Admitting: General Surgery

## 2013-05-15 ENCOUNTER — Encounter (INDEPENDENT_AMBULATORY_CARE_PROVIDER_SITE_OTHER): Payer: Self-pay | Admitting: General Surgery

## 2013-05-15 VITALS — BP 122/72 | HR 72 | Temp 98.5°F | Resp 14 | Ht 62.5 in | Wt 195.2 lb

## 2013-05-15 DIAGNOSIS — K912 Postsurgical malabsorption, not elsewhere classified: Secondary | ICD-10-CM

## 2013-05-15 NOTE — Progress Notes (Signed)
Subjective:     Patient ID: [name], female   DOB: 20-May-1976, 37 y.o.   MRN: [id number]  HPI This patient follows up 6 months status post vertical sleeve gastrectomy for obesity. She has lost about 59 pounds since her procedure but she would like to get down to about 140 pounds. She says that she really hasn't been losing weight recently and has been feeling more hungry. She is snacking although she does have difficulty with rice and bread. She saw Dr. Johna Sheriff about one month ago she was having some nausea and vomiting but this has since resolved and she is back to normal. This was around the time of Ramadan and she was fasting for most of the day and she feels as though she was eating too fast. She has no abdominal pain. She also has stopped exercising for June and July although she is going to restart the belt programs. She also is not taking her protein and not drinking very much as well. She says that she is taking her vitamins  Review of Systems     Objective:   Physical Exam No acute distress and nontoxic-appearing Her abdomen is soft and nontender there is no evidence of any neurologic deficits    Assessment:     Status post vertical sleeve gastrectomy I think that she is doing findings although I would like to see her lose another 30 pounds. I think this is reasonable for the next 6 months but I did recommend that she keep a food journal and focused on her diet as well as restarting with physical activity and exercise. We will check some nutrition labs. I will see her back in 3 months and we will see how she is doing at that time. Hopefully she will lose another 15 pounds in that time     Plan:     Check nutrition labs and restart exercise program and protein supplements and increase fluid intake and I will see her back in about 3 months

## 2013-05-17 LAB — CBC WITH DIFFERENTIAL/PLATELET
Eosinophils Relative: 2 % (ref 0–5)
HCT: 37.8 % (ref 36.0–46.0)
Lymphocytes Relative: 34 % (ref 12–46)
Lymphs Abs: 2.5 10*3/uL (ref 0.7–4.0)
MCV: 74.3 fL — ABNORMAL LOW (ref 78.0–100.0)
Monocytes Absolute: 0.4 10*3/uL (ref 0.1–1.0)
Platelets: 254 10*3/uL (ref 150–400)
RBC: 5.09 MIL/uL (ref 3.87–5.11)
WBC: 7.4 10*3/uL (ref 4.0–10.5)

## 2013-05-17 LAB — VITAMIN B12: Vitamin B-12: 1971 pg/mL — ABNORMAL HIGH (ref 211–911)

## 2013-05-17 LAB — COMPREHENSIVE METABOLIC PANEL
ALT: 8 U/L (ref 0–35)
Albumin: 4.1 g/dL (ref 3.5–5.2)
CO2: 24 mEq/L (ref 19–32)
Calcium: 9 mg/dL (ref 8.4–10.5)
Chloride: 105 mEq/L (ref 96–112)
Creat: 0.57 mg/dL (ref 0.50–1.10)

## 2013-05-20 LAB — VITAMIN D 1,25 DIHYDROXY: Vitamin D2 1, 25 (OH)2: <8 pg/mL

## 2013-08-01 ENCOUNTER — Other Ambulatory Visit: Payer: Self-pay

## 2013-08-14 ENCOUNTER — Ambulatory Visit: Payer: 59 | Admitting: *Deleted

## 2013-10-08 ENCOUNTER — Ambulatory Visit: Payer: 59 | Admitting: Dietician

## 2013-12-03 LAB — PULMONARY FUNCTION TEST

## 2013-12-26 ENCOUNTER — Telehealth: Payer: Self-pay | Admitting: Internal Medicine

## 2013-12-26 NOTE — Telephone Encounter (Signed)
[id number]. Pt can be added on 01-10-14. Carron CurieJennifer Elsie Baynes, CMA

## 2013-12-26 NOTE — Telephone Encounter (Signed)
Leslie DikeJennifer, please advise when to put patient on (time wise) on the schedule. Thanks.

## 2013-12-26 NOTE — Telephone Encounter (Signed)
PCP Georgianne FickAMACHANDRAN,AJITH, MD called. Patient is pregnant. [place] from [place]. Asthma out of control. Needed 2 pred courses. Get her in 4/13 opr 4/14 pr 4/15. NEw consult. Ask if she needs interpreter. Needs spirometry by office staff on day of visit

## 2013-12-30 ENCOUNTER — Ambulatory Visit: Payer: 59 | Admitting: Dietician

## 2013-12-31 NOTE — Telephone Encounter (Signed)
[email or web address]. Vennessa Affinito, CMA  

## 2014-01-02 NOTE — Telephone Encounter (Signed)
Someone finally answered and advised that the # is incorrect #, no person at that # by this name. Do you want me to send a letter? Carron CurieJennifer Saia Derossett, CMA

## 2014-01-06 NOTE — Telephone Encounter (Signed)
[facility] and speak to Dr Nicholos Johnsamachandran nurse and identify correct patient and contact. I am sure he identified to me this [name] as the patient   Dr. Kalman ShanMurali Chrisanne Loose, M.D., [facility]P Pulmonary and Critical Care Medicine Staff Physician [facility] Pager: [phone], If no answer or between  15:00h - 7:00h: call [phone]  01/06/2014 8:48 AM

## 2014-01-07 ENCOUNTER — Encounter: Payer: Self-pay | Admitting: Internal Medicine

## 2014-01-07 ENCOUNTER — Ambulatory Visit (INDEPENDENT_AMBULATORY_CARE_PROVIDER_SITE_OTHER): Payer: 59 | Admitting: Internal Medicine

## 2014-01-07 VITALS — BP 112/78 | HR 66 | Temp 98.3°F | Ht 63.0 in | Wt 172.8 lb

## 2014-01-07 DIAGNOSIS — IMO0002 Reserved for concepts with insufficient information to code with codable children: Secondary | ICD-10-CM

## 2014-01-07 DIAGNOSIS — J45909 Unspecified asthma, uncomplicated: Secondary | ICD-10-CM | POA: Insufficient documentation

## 2014-01-07 DIAGNOSIS — O9989 Other specified diseases and conditions complicating pregnancy, childbirth and the puerperium: Secondary | ICD-10-CM

## 2014-01-07 DIAGNOSIS — O99519 Diseases of the respiratory system complicating pregnancy, unspecified trimester: Principal | ICD-10-CM

## 2014-01-07 MED ORDER — LEVALBUTEROL TARTRATE 45 MCG/ACT IN AERO: INHALATION_SPRAY | RESPIRATORY_TRACT | Status: DC

## 2014-01-07 MED ORDER — ACETAMINOPHEN-CODEINE #3 300-30 MG PO TABS: ORAL_TABLET | ORAL | Status: DC

## 2014-01-07 MED ORDER — BUDESONIDE-FORMOTEROL FUMARATE 160-4.5 MCG/ACT IN AERO: INHALATION_SPRAY | RESPIRATORY_TRACT | Status: DC

## 2014-01-07 MED ORDER — PREDNISONE 10 MG PO TABS: ORAL_TABLET | ORAL | Status: DC

## 2014-01-07 NOTE — Progress Notes (Signed)
Subjective:    Patient ID: [name], female    DOB: 1975/11/30   MRN: [id number]  HPI  38 yo [place] female grew up smokers and childhood asthma required daily inhalers [place] much better since then and stopped  Inhaler / then stopped smoking 2014 then started lupron 11/2013 then started chest tightness/ cough better with inhaler saba better p stopped lupron then worse again after started progresterone at [redacted] weeks gestation referred by Dr Linward Natalamachandra 01/07/14 to pulm clinic for dtc asthma   01/07/2014 1st [facility] office visit/ Aviraj Kentner   [redacted] weeks pregnant  Chief Complaint  Patient presents with  . Pulmonary Consult    Referred per Dr. Nicholos Johnsamachandran. Pt states that she has had asthm all of her life. She had been doing well with no need for rescue inhaler x 5 yrs, until 3 wks ago developed cough and dyspnea. She states that she is SOB all the time, esp worse at night. She wakes up several times every night with SOB. Her cough is occ prod with minimal clear sputum.   new problem severe min prod cough to point of vomiting and chest tightness better with neb bid xopenenx and hfa every hour but very concerned about exposing fetus to other meds.  No obvious other patterns in day to day or daytime variabilty or assoc cp or   overt sinus or hb symptoms. No unusual exp hx or h/o childhood pna/ asthma or knowledge of premature birth.  Sleeping ok without nocturnal  or early am exacerbation  of respiratory  c/o's or need for noct saba. Also denies any obvious fluctuation of symptoms with weather or environmental changes or other aggravating or alleviating factors except as outlined above   Current Medications, Allergies, Complete Past Medical History, Past Surgical History, Family History, and Social History were reviewed in [facility] electronic medical record.          Note h/o morbid obesity > gastric sleeve 10/2012 and documented severe GERD       Review of Systems  Constitutional: Negative for fever, chills and unexpected weight change.  HENT: Positive for sneezing. Negative for congestion, dental problem, ear pain, nosebleeds, postnasal drip, rhinorrhea, sinus pressure, sore throat, trouble swallowing and voice change.   Eyes: Negative for visual disturbance.  Respiratory: Positive for cough and shortness of breath. Negative for choking.   Cardiovascular: Negative for chest pain and leg swelling.  Gastrointestinal: Negative for vomiting, abdominal pain and diarrhea.  Genitourinary: Negative for difficulty urinating.  Musculoskeletal: Negative for arthralgias.  Skin: Negative for rash.  Neurological: Negative for tremors, syncope and headaches.  Hematological: Does not bruise/bleed easily.       Objective:   Physical Exam  amb [place] female nad  Wt Readings from Last 3 Encounters:  01/07/14 172 lb 12.8 oz (78.382 kg)  05/15/13 195 lb 3.2 oz (88.542 kg)  05/14/13 195 lb 8 oz (88.678 kg)      HEENT: nl dentition, turbinates, and orophanx. Nl external ear canals without cough reflex   NECK :  without JVD/Nodes/TM/ nl carotid upstrokes bilaterally   LUNGS: no acc muscle use,  Mid to late bilateral exp wheeze   CV:  RRR  no s3 or murmur or increase in P2, no edema   ABD:  soft and nontender with nl excursion in the supine position. No bruits or organomegaly, bowel sounds nl  MS:  warm without deformities, calf tenderness, cyanosis  or clubbing  SKIN: warm and dry without lesions    NEURO:  alert, approp, no deficits     UGI 04/29/13  1. Significant spontaneous gastroesophageal reflux.      Assessment & Plan:

## 2014-01-07 NOTE — Patient Instructions (Addendum)
Plan A = automatic= symbicort 160 Take 2 puffs first thing in am and then another 2 puffs about 12 hours later.    Plan B = Backup for breathing  Only use your levoalbuterol as a rescue medication to be used if you can't catch your breath by resting or doing a relaxed purse lip breathing pattern.  - The less you use it, the better it will work when you need it. - Ok to use up to 2 puffs  every 4 hours if you must but call for immediate appointment if use goes up over your usual need - Don't leave home without it !!  (think of it like the spare tire for your car)   If not better from levoalbuterol then ok to use nebulizer up to every 4 hours   Plan B for cough  If can't stop coughing > robitussin DM first and tylenol #3 every 4 hours as needed  Plan C If all else fails to control your symptoms >  Prednisone 10 mg take  4 each am x 2 days,   2 each am x 2 days,  1 each am x 2 days and stop   GERD (REFLUX)  is an extremely common cause of respiratory symptoms, many times with no significant heartburn at all.    It can be treated with medication, but also with lifestyle changes including avoidance of late meals, excessive alcohol, smoking cessation, and avoid fatty foods, chocolate, peppermint, colas, red wine, and acidic juices such as orange juice.  NO MINT OR MENTHOL PRODUCTS SO NO COUGH DROPS  USE SUGARLESS CANDY INSTEAD (jolley ranchers or Stover's)  NO OIL BASED VITAMINS - use powdered substitutes.      Please schedule a follow up office visit in 2 weeks, sooner if needed

## 2014-01-07 NOTE — Telephone Encounter (Signed)
Pt saw Dr. Sherene SiresWert for consult. Carron CurieJennifer Castillo, CMA

## 2014-01-08 ENCOUNTER — Encounter: Payer: Self-pay | Admitting: Internal Medicine

## 2014-01-08 NOTE — Assessment & Plan Note (Signed)
DDX of  difficult airways managment all start with A and  include Adherence, Ace Inhibitors, Acid Reflux, Active Sinus Disease, Alpha 1 Antitripsin deficiency, Anxiety masquerading as Airways dz,  ABPA,  allergy(esp in young), Aspiration (esp in elderly), Adverse effects of DPI,  Active smokers, plus two Bs  = Bronchiectasis and Beta blocker use..and one C= CHF  Adherence is always the initial "prime suspect" and is a multilayered concern that requires a "trust but verify" approach in every patient - starting with knowing how to use medications, especially inhalers, correctly, keeping up with refills and understanding the fundamental difference between maintenance and prns vs those medications only taken for a very short course and then stopped and not refilled.  - The proper method of use, as well as anticipated side effects, of a metered-dose inhaler are discussed and demonstrated to the patient. Improved effectiveness after extensive coaching during this visit to a level of approximately  75% so start symbicort 160 2bid as she is clearly over using saba and risking fetal demise with poorly controlled asthma (Discussed in detail all the  indications, usual  risks and alternatives  relative to the benefits with patient who agrees to proceed with this over present rx)  ? Acid (or non-acid) GERD > always difficult to exclude as up to 75% of pts in some series report no assoc GI/ Heartburn symptoms> note at baseline she has severe gerd by ugi w/in past year and this may become a major issue esp on progresterone as the uterus starts to cause gastric compression or she continues to cough (increasing gastric pressure)   See instructions for specific recommendations which were reviewed directly with the patient who was given a copy with highlighter outlining the key components.

## 2014-01-22 ENCOUNTER — Ambulatory Visit (INDEPENDENT_AMBULATORY_CARE_PROVIDER_SITE_OTHER): Payer: 59 | Admitting: Internal Medicine

## 2014-01-22 ENCOUNTER — Encounter: Payer: Self-pay | Admitting: Internal Medicine

## 2014-01-22 VITALS — BP 118/80 | HR 70 | Temp 98.3°F | Ht 63.0 in | Wt 174.2 lb

## 2014-01-22 DIAGNOSIS — K219 Gastro-esophageal reflux disease without esophagitis: Secondary | ICD-10-CM

## 2014-01-22 DIAGNOSIS — IMO0002 Reserved for concepts with insufficient information to code with codable children: Secondary | ICD-10-CM

## 2014-01-22 DIAGNOSIS — O99519 Diseases of the respiratory system complicating pregnancy, unspecified trimester: Principal | ICD-10-CM

## 2014-01-22 DIAGNOSIS — O9989 Other specified diseases and conditions complicating pregnancy, childbirth and the puerperium: Secondary | ICD-10-CM

## 2014-01-22 DIAGNOSIS — J45909 Unspecified asthma, uncomplicated: Secondary | ICD-10-CM

## 2014-01-22 NOTE — Progress Notes (Signed)
Subjective:    Patient ID: [name], female    DOB: 1976/09/22   MRN: [id number]    Brief patient profile:  38 yo [place] female grew up smokers and childhood asthma required daily inhalers moved to [place] to 2001 and then moved to [place] 2008 much better since then and stopped  Inhaler / then stopped smoking 2014 then started lupron 11/2013 then started chest tightness/ cough better with inhaler saba better p stopped lupron then worse again after started progresterone at [redacted] weeks gestation referred by Dr Linward Natalamachandra 01/07/14 to pulm clinic for dtc asthma   History of Present Illness  01/07/2014 1st [facility] office visit/ Wert   [redacted] weeks pregnant  Chief Complaint  Patient presents with  . Pulmonary Consult    Referred per Dr. Nicholos Johnsamachandran. Pt states that she has had asthm all of her life. She had been doing well with no need for rescue inhaler x 5 yrs, until 3 wks ago developed cough and dyspnea. She states that she is SOB all the time, esp worse at night. She wakes up several times every night with SOB. Her cough is occ prod with minimal clear sputum.   new problem severe min prod cough to point of vomiting and chest tightness better with neb bid xopenenx and hfa every hour but very concerned about exposing fetus to other meds. rec Plan A = automatic= symbicort 160 Take 2 puffs first thing in am and then another 2 puffs about 12 hours later.  Plan B = Backup for breathing  Only use your levoalbuterol as a rescue medication  If not better from levoalbuterol then ok to use nebulizer up to every 4 hours  Plan B for cough  If can't stop coughing > robitussin DM first and tylenol #3 every 4 hours as needed Plan C If all else fails to control your symptoms >  Prednisone 10 mg take  4 each am x 2 days,   2 each am x 2 days,  1 each am x 2 days and stop  GERD diet    01/22/2014 f/u ov/Wert re: asthma in pregnancy with documented GERD [redacted] weeks gestation Chief Complaint  Patient presents  with  . Follow-up    Pt states she has imporved. States cough is almost resolved. Pt states SOB has improved but intermittently becomes dyspneic with and without activity. Pt states overall she feels much better.   Not limited by breathing from desired activities  No need for saba at all, not using symbicort regularly  No obvious day to day or daytime variabilty or assoc chronic cough or cp or chest tightness, subjective wheeze overt sinus or hb symptoms. No unusual exp hx or h/o childhood pna/ asthma or knowledge of premature birth.  Sleeping ok without nocturnal  or early am exacerbation  of respiratory  c/o's or need for noct saba. Also denies any obvious fluctuation of symptoms with weather or environmental changes or other aggravating or alleviating factors except as outlined above   Current Medications, Allergies, Complete Past Medical History, Past Surgical History, Family History, and Social History were reviewed in [facility] electronic medical record.  ROS  The following are not active complaints unless bolded sore throat, dysphagia, dental problems, itching, sneezing,  nasal congestion or excess/ purulent secretions, ear ache,   fever, chills, sweats, unintended wt loss, pleuritic or exertional cp, hemoptysis,  orthopnea pnd or leg swelling, presyncope, palpitations, heartburn, abdominal pain, anorexia, nausea, vomiting, diarrhea  or change in  bowel or urinary habits, change in stools or urine, dysuria,hematuria,  rash, arthralgias, visual complaints, headache, numbness weakness or ataxia or problems with walking or coordination,  change in mood/affect or memory.               Note h/o morbid obesity > gastric sleeve 10/2012 and documented severe GERD           Objective:   Physical Exam  amb [place] female nad  Wt Readings from Last 3 Encounters:  01/07/14 172 lb 12.8 oz (78.382 kg)  05/15/13 195 lb 3.2 oz (88.542 kg)  05/14/13 195 lb 8 oz (88.678 kg)      HEENT:  nl dentition, turbinates, and orophanx. Nl external ear canals without cough reflex   NECK :  without JVD/Nodes/TM/ nl carotid upstrokes bilaterally   LUNGS: no acc muscle use,  Now clear to A and P   CV:  RRR  no s3 or murmur or increase in P2, no edema   ABD:  soft and nontender with nl excursion in the supine position. No bruits or organomegaly, bowel sounds nl  MS:  warm without deformities, calf tenderness, cyanosis or clubbing  SKIN: warm and dry without lesions    NEURO:  alert, approp, no deficits     UGI 04/29/13  1. Significant spontaneous gastroesophageal reflux.      Assessment & Plan:

## 2014-01-22 NOTE — Patient Instructions (Addendum)
Plan A = automatic= symbicort 160 Take 2 puffs first thing in am and then another 2 puffs about 12 hours later ok to adjust down but don't exceed 2 pffs every 12 hours as max dose    Plan B = Backup for breathing  Only use your levoalbuterol as a rescue medication to be used if you can't catch your breath by resting or doing a relaxed purse lip breathing pattern.  - The less you use it, the better it will work when you need it. - Ok to use up to 2 puffs  every 4 hours if you must but call for immediate appointment if use goes up over your usual need - Don't leave home without it !!  (think of it like the spare tire for your car)   If not better from levoalbuterol then ok to use nebulizer up to every 4 hours and call right away for appointment    Please schedule a follow up visit in 2 months but call sooner if needed (ask for Tammy NP)

## 2014-01-23 DIAGNOSIS — K219 Gastro-esophageal reflux disease without esophagitis: Secondary | ICD-10-CM | POA: Insufficient documentation

## 2014-01-23 NOTE — Assessment & Plan Note (Addendum)
-  spirometry 12/03/13 FEV1 1.41 > 2.05 p B2 with ratio 42% but very abn f/v loop   Despite inconsistent rx with symbicort, all goals of chronic asthma control met including optimal function and elimination of symptoms with minimal need for rescue therapy.  Since symbicort works w/in 5 min ok to reduce dose as long as good control and no need for saba but should keep saba on hand at all times.  Contingencies discussed in full including contacting this office immediately if not controlling the symptoms using the rule of two's.

## 2014-01-23 NOTE — Assessment & Plan Note (Signed)
See   UGI 04/29/13  1. Significant spontaneous gastroesophageal reflux.  Reviewed diet/ ? Better off progesterone but likely to worsen as gestation progresses but for now no medical rx indicated

## 2014-01-28 LAB — OB RESULTS CONSOLE HEPATITIS B SURFACE ANTIGEN: Hepatitis B Surface Ag: NEGATIVE

## 2014-01-28 LAB — OB RESULTS CONSOLE RUBELLA ANTIBODY, IGM: Rubella: IMMUNE

## 2014-01-28 LAB — OB RESULTS CONSOLE RPR: RPR: NONREACTIVE

## 2014-01-28 LAB — OB RESULTS CONSOLE HIV ANTIBODY (ROUTINE TESTING): HIV: NONREACTIVE

## 2014-01-28 LAB — OB RESULTS CONSOLE GC/CHLAMYDIA
Chlamydia: NEGATIVE
GC PROBE AMP, GENITAL: NEGATIVE

## 2014-01-29 ENCOUNTER — Ambulatory Visit (INDEPENDENT_AMBULATORY_CARE_PROVIDER_SITE_OTHER): Payer: 59 | Admitting: Surgery

## 2014-02-05 ENCOUNTER — Ambulatory Visit (INDEPENDENT_AMBULATORY_CARE_PROVIDER_SITE_OTHER): Payer: 59 | Admitting: Surgery

## 2014-02-21 ENCOUNTER — Inpatient Hospital Stay (HOSPITAL_COMMUNITY)
Admission: AD | Admit: 2014-02-21 | Discharge: 2014-02-21 | Disposition: A | Discharge status: Home / Self Care | Payer: 59 | Source: Ambulatory Visit | Attending: Obstetrics and Gynecology | Admitting: Obstetrics and Gynecology

## 2014-02-21 ENCOUNTER — Encounter (HOSPITAL_COMMUNITY): Payer: Self-pay | Admitting: *Deleted

## 2014-02-21 DIAGNOSIS — Z87891 Personal history of nicotine dependence: Secondary | ICD-10-CM | POA: Insufficient documentation

## 2014-02-21 DIAGNOSIS — Z9884 Bariatric surgery status: Secondary | ICD-10-CM | POA: Insufficient documentation

## 2014-02-21 DIAGNOSIS — E860 Dehydration: Secondary | ICD-10-CM | POA: Insufficient documentation

## 2014-02-21 DIAGNOSIS — O99280 Endocrine, nutritional and metabolic diseases complicating pregnancy, unspecified trimester: Secondary | ICD-10-CM

## 2014-02-21 DIAGNOSIS — E039 Hypothyroidism, unspecified: Secondary | ICD-10-CM | POA: Insufficient documentation

## 2014-02-21 DIAGNOSIS — O211 Hyperemesis gravidarum with metabolic disturbance: Secondary | ICD-10-CM | POA: Insufficient documentation

## 2014-02-21 DIAGNOSIS — E079 Disorder of thyroid, unspecified: Secondary | ICD-10-CM | POA: Insufficient documentation

## 2014-02-21 DIAGNOSIS — R109 Unspecified abdominal pain: Secondary | ICD-10-CM | POA: Insufficient documentation

## 2014-02-21 MED ORDER — LACTATED RINGERS IV BOLUS (SEPSIS)
1000.0000 mL | Freq: Once | INTRAVENOUS | Status: AC
  Administered 2014-02-21: 1000 mL via INTRAVENOUS

## 2014-02-21 NOTE — MAU Provider Note (Signed)
History     CSN: [id number]  Arrival date and time: 02/21/14 1239   First Provider Initiated Contact with Patient 02/21/14 1318      No chief complaint on file.  HPI [name] is a 38 y.o. G1P0 at 7355w0d who presents to MAU today from the office for IV fluids. The patient states that she had severe N/V earlier in the pregnancy that has improved significantly since last week. She states only 2 episodes of vomiting this week. She states that she gags when trying to drink water and that all other drinks make her nauseous because of the smell. She also endorses mild lower abdominal cramping bilaterally. She denies vaginal bleeding or headache. She does have a history of bariatric surgery in which she had a gastric sleeve placed.   OB History   Grav Para Term Preterm Abortions TAB SAB Ect Mult Living   1               Past Medical History  Diagnosis Date  . Hypothyroidism   . Polycystic ovarian syndrome   . Asthma     has rescue inhaler  . Obesity   . Hyperlipidemia   . Plantar fasciitis, bilateral   . Hypertension     r sept 2013 high readings- no meds  . Anemia     Past Surgical History  Procedure Laterality Date  . Plantar fascia release    . Hysteroscopy w/d&c  06/01/2012    Procedure: DILATATION AND CURETTAGE /HYSTEROSCOPY;  Surgeon: Zelphia CairoGretchen Adkins, MD;  Location: [facility];  Service: Gynecology;  Laterality: N/A;  with truclear  . Cervical polypectomy      05/2012  . Planter Left sept 2012  . Planter    . Plantar fascia surgery  05/2011    left foot  . Plantar fascia surgery  04/2011    rt foot  . Laparoscopic gastric sleeve resection N/A 11/19/2012    Procedure: LAPAROSCOPIC GASTRIC SLEEVE RESECTION;  Surgeon: Lodema PilotBrian Layton, DO;  Location: [facility];  Service: General;  Laterality: N/A;  laparoscopic sleeve gastrectomy with EGD  . Esophagogastroduodenoscopy N/A 11/19/2012    Procedure: ESOPHAGOGASTRODUODENOSCOPY (EGD);  Surgeon: Lodema PilotBrian Layton, DO;  Location: [facility];   Service: General;  Laterality: N/A;    Family History  Problem Relation Age of Onset  . Cancer Paternal Aunt     breast and colon  . Asthma Paternal Grandmother   . Heart disease Paternal Grandmother     History  Substance Use Topics  . Smoking status: Former Smoker -- 0.03 packs/day for .5 years    Types: Cigarettes    Quit date: 10/29/2012  . Smokeless tobacco: Never Used     Comment: smoked socially only   . Alcohol Use: No    Allergies: No Known Allergies  Prescriptions prior to admission  Medication Sig Dispense Refill  . budesonide-formoterol (SYMBICORT) 160-4.5 MCG/ACT inhaler Inhale 2 puffs into the lungs every morning.      . levalbuterol (XOPENEX) 1.25 MG/3ML nebulizer solution 1 every 4 hours via nebulizer as needed      . Prenatal Vit-Fe Fumarate-FA (PRENATAL MULTIVITAMIN) TABS tablet Take 1 tablet by mouth at bedtime.      Marland Kitchen. thyroid (ARMOUR) 90 MG tablet Take 90 mg by mouth daily before breakfast.       . [DISCONTINUED] budesonide-formoterol (SYMBICORT) 160-4.5 MCG/ACT inhaler Take 2 puffs first thing in am and then another 2 puffs about 12 hours later.      .Marland Kitchen  levalbuterol (XOPENEX HFA) 45 MCG/ACT inhaler 2 puffs every 4-6 hours as needed  1 Inhaler  1    Review of Systems  Constitutional: Negative for fever and malaise/fatigue.  Gastrointestinal: Positive for nausea, vomiting and abdominal pain. Negative for diarrhea and constipation.  Genitourinary: Positive for urgency. Negative for dysuria and frequency.       Neg - vaginal bleeding  Neurological: Positive for dizziness. Negative for loss of consciousness and weakness.   Physical Exam   Blood pressure 106/61, pulse 75, temperature 98.5 F (36.9 C), temperature source Oral, resp. rate 16, height 5\' 3"  (1.6 m), weight 170 lb (77.111 kg), last menstrual period 11/08/2013.  Physical Exam  Constitutional: She is oriented to person, place, and time. She appears well-developed and well-nourished. No distress.   HENT:  Head: Normocephalic and atraumatic.  Cardiovascular: Normal rate, regular rhythm and normal heart sounds.   Respiratory: Effort normal and breath sounds normal. No respiratory distress.  GI: Soft. Bowel sounds are normal. She exhibits no distension and no mass. There is no tenderness. There is no rebound and no guarding.  Neurological: She is alert and oriented to person, place, and time.  Skin: Skin is warm and dry. No erythema.  Psychiatric: She has a normal mood and affect.    MAU Course  Procedures None  MDM FHR - 158 bpm with doppler Unable to given urine sample Discussed with Dr. Arelia Sneddon. 1 liter bolus of LR.  Patient up to BR. No sample collected Discussed with Dr. Arelia Sneddon. Patient is OK for discharge. Advised Pedialyte for hydration. Follow-up as scheduled  Assessment and Plan  A: SIUP at [redacted]w[redacted]d Dehydration  P: Discharge home Patient advised to drink Pedialyte for hydration Patient advised to follow-up with Dr. Arelia Sneddon as scheduled or sooner if symptoms persist or worsen Patient may return to MAU as needed or if her condition were to change or worsen  Freddi Starr, PA-C  02/21/2014, 2:34 PM

## 2014-02-21 NOTE — Discharge Instructions (Signed)
Dehydration, Adult Dehydration is when you lose more fluids from the body than you take in. Vital organs like the kidneys, brain, and heart cannot function without a proper amount of fluids and salt. Any loss of fluids from the body can cause dehydration.  CAUSES   Vomiting.  Diarrhea.  Excessive sweating.  Excessive urine output.  Fever. SYMPTOMS  Mild dehydration  Thirst.  Dry lips.  Slightly dry mouth. Moderate dehydration  Very dry mouth.  Sunken eyes.  Skin does not bounce back quickly when lightly pinched and released.  Dark urine and decreased urine production.  Decreased tear production.  Headache. Severe dehydration  Very dry mouth.  Extreme thirst.  Rapid, weak pulse (more than 100 beats per minute at rest).  Cold hands and feet.  Not able to sweat in spite of heat and temperature.  Rapid breathing.  Blue lips.  Confusion and lethargy.  Difficulty being awakened.  Minimal urine production.  No tears. DIAGNOSIS  Your caregiver will diagnose dehydration based on your symptoms and your exam. Blood and urine tests will help confirm the diagnosis. The diagnostic evaluation should also identify the cause of dehydration. TREATMENT  Treatment of mild or moderate dehydration can often be done at home by increasing the amount of fluids that you drink. It is best to drink small amounts of fluid more often. Drinking too much at one time can make vomiting worse. Refer to the home care instructions below. Severe dehydration needs to be treated at the hospital where you will probably be given intravenous (IV) fluids that contain water and electrolytes. HOME CARE INSTRUCTIONS   Ask your caregiver about specific rehydration instructions.  Drink enough fluids to keep your urine clear or pale yellow.  Drink small amounts frequently if you have nausea and vomiting.  Eat as you normally do.  Avoid:  Foods or drinks high in sugar.  Carbonated  drinks.  Juice.  Extremely hot or cold fluids.  Drinks with caffeine.  Fatty, greasy foods.  Alcohol.  Tobacco.  Overeating.  Gelatin desserts.  Wash your hands well to avoid spreading bacteria and viruses.  Only take over-the-counter or prescription medicines for pain, discomfort, or fever as directed by your caregiver.  Ask your caregiver if you should continue all prescribed and over-the-counter medicines.  Keep all follow-up appointments with your caregiver. SEEK MEDICAL CARE IF:  You have abdominal pain and it increases or stays in one area (localizes).  You have a rash, stiff neck, or severe headache.  You are irritable, sleepy, or difficult to awaken.  You are weak, dizzy, or extremely thirsty. SEEK IMMEDIATE MEDICAL CARE IF:   You are unable to keep fluids down or you get worse despite treatment.  You have frequent episodes of vomiting or diarrhea.  You have blood or green matter (bile) in your vomit.  You have blood in your stool or your stool looks black and tarry.  You have not urinated in 6 to 8 hours, or you have only urinated a small amount of very dark urine.  You have a fever.  You faint. MAKE SURE YOU:   Understand these instructions.  Will watch your condition.  Will get help right away if you are not doing well or get worse. Document Released: 09/12/2005 Document Revised: 12/05/2011 Document Reviewed: 05/02/2011 [employer] Patient Information 2014 [employer].  

## 2014-02-21 NOTE — MAU Note (Signed)
Pt sent from office because of difficulty drinking.  Says she has not vomited for the past 4 days but any liquid just smells terrible to her.  Denies nausea or vomiting today.  Reports right sided, low, sharp, intermittant, abd pain.

## 2014-02-27 ENCOUNTER — Other Ambulatory Visit: Payer: Self-pay

## 2014-03-02 ENCOUNTER — Telehealth: Payer: Self-pay | Admitting: Internal Medicine

## 2014-03-02 MED ORDER — BUDESONIDE-FORMOTEROL FUMARATE 160-4.5 MCG/ACT IN AERO: 2.0000 | INHALATION_SPRAY | Freq: Every morning | RESPIRATORY_TRACT | Status: DC

## 2014-03-02 NOTE — Telephone Encounter (Signed)
Was supposed to see [name] next so move this appt up to early week of June 8

## 2014-03-02 NOTE — Telephone Encounter (Signed)
Having asthma. Asks refill Symbicort to [facility]. Still has rescue and nebulizer meds. Expolained Symbicort not a rescue med. If she doesn't improve quickly go to UC or ER. Call office for f/u Dr Sherene Sires in AM

## 2014-03-20 ENCOUNTER — Other Ambulatory Visit (HOSPITAL_COMMUNITY): Payer: Self-pay | Admitting: Obstetrics and Gynecology

## 2014-03-20 DIAGNOSIS — R772 Abnormality of alphafetoprotein: Secondary | ICD-10-CM

## 2014-03-20 DIAGNOSIS — Z3689 Encounter for other specified antenatal screening: Secondary | ICD-10-CM

## 2014-03-24 ENCOUNTER — Ambulatory Visit (HOSPITAL_COMMUNITY)
Admission: RE | Admit: 2014-03-24 | Discharge: 2014-03-24 | Disposition: A | Discharge status: Home / Self Care | Payer: 59 | Source: Ambulatory Visit | Attending: Obstetrics and Gynecology | Admitting: Obstetrics and Gynecology

## 2014-03-24 ENCOUNTER — Ambulatory Visit: Payer: 59 | Admitting: Internal Medicine

## 2014-03-24 DIAGNOSIS — Z3689 Encounter for other specified antenatal screening: Secondary | ICD-10-CM | POA: Insufficient documentation

## 2014-03-24 DIAGNOSIS — R772 Abnormality of alphafetoprotein: Secondary | ICD-10-CM

## 2014-03-27 ENCOUNTER — Other Ambulatory Visit (HOSPITAL_COMMUNITY): Payer: Self-pay | Admitting: Obstetrics and Gynecology

## 2014-03-27 ENCOUNTER — Other Ambulatory Visit (HOSPITAL_COMMUNITY): Payer: 59

## 2014-03-27 DIAGNOSIS — O99280 Endocrine, nutritional and metabolic diseases complicating pregnancy, unspecified trimester: Secondary | ICD-10-CM

## 2014-03-27 DIAGNOSIS — O9989 Other specified diseases and conditions complicating pregnancy, childbirth and the puerperium: Secondary | ICD-10-CM

## 2014-03-27 DIAGNOSIS — O09519 Supervision of elderly primigravida, unspecified trimester: Secondary | ICD-10-CM

## 2014-03-27 DIAGNOSIS — E079 Disorder of thyroid, unspecified: Secondary | ICD-10-CM

## 2014-03-27 DIAGNOSIS — O09819 Supervision of pregnancy resulting from assisted reproductive technology, unspecified trimester: Secondary | ICD-10-CM

## 2014-03-27 DIAGNOSIS — O350XX1 Maternal care for (suspected) central nervous system malformation in fetus, fetus 1: Secondary | ICD-10-CM

## 2014-03-27 DIAGNOSIS — O3500X1 Maternal care for (suspected) central nervous system malformation or damage in fetus, unspecified, fetus 1: Secondary | ICD-10-CM

## 2014-04-21 ENCOUNTER — Ambulatory Visit (HOSPITAL_COMMUNITY)
Admission: RE | Admit: 2014-04-21 | Discharge: 2014-04-21 | Disposition: A | Discharge status: Home / Self Care | Payer: 59 | Source: Ambulatory Visit | Attending: Obstetrics and Gynecology | Admitting: Obstetrics and Gynecology

## 2014-04-21 ENCOUNTER — Ambulatory Visit: Payer: 59 | Admitting: Internal Medicine

## 2014-04-21 VITALS — BP 108/70 | HR 76 | Wt 182.2 lb

## 2014-04-21 DIAGNOSIS — O3500X1 Maternal care for (suspected) central nervous system malformation or damage in fetus, unspecified, fetus 1: Secondary | ICD-10-CM

## 2014-04-21 DIAGNOSIS — O9989 Other specified diseases and conditions complicating pregnancy, childbirth and the puerperium: Secondary | ICD-10-CM | POA: Insufficient documentation

## 2014-04-21 DIAGNOSIS — O99280 Endocrine, nutritional and metabolic diseases complicating pregnancy, unspecified trimester: Secondary | ICD-10-CM

## 2014-04-21 DIAGNOSIS — O09519 Supervision of elderly primigravida, unspecified trimester: Secondary | ICD-10-CM

## 2014-04-21 DIAGNOSIS — E079 Disorder of thyroid, unspecified: Secondary | ICD-10-CM | POA: Diagnosis not present

## 2014-04-21 DIAGNOSIS — O09899 Supervision of other high risk pregnancies, unspecified trimester: Secondary | ICD-10-CM

## 2014-04-21 DIAGNOSIS — O09819 Supervision of pregnancy resulting from assisted reproductive technology, unspecified trimester: Secondary | ICD-10-CM | POA: Insufficient documentation

## 2014-04-21 DIAGNOSIS — O350XX1 Maternal care for (suspected) central nervous system malformation in fetus, fetus 1: Secondary | ICD-10-CM

## 2014-04-21 DIAGNOSIS — Z3689 Encounter for other specified antenatal screening: Secondary | ICD-10-CM | POA: Diagnosis not present

## 2014-04-21 DIAGNOSIS — O3500X0 Maternal care for (suspected) central nervous system malformation or damage in fetus, unspecified, not applicable or unspecified: Secondary | ICD-10-CM | POA: Diagnosis not present

## 2014-04-21 DIAGNOSIS — O09529 Supervision of elderly multigravida, unspecified trimester: Secondary | ICD-10-CM | POA: Insufficient documentation

## 2014-04-21 DIAGNOSIS — O280 Abnormal hematological finding on antenatal screening of mother: Secondary | ICD-10-CM

## 2014-04-21 DIAGNOSIS — O350XX0 Maternal care for (suspected) central nervous system malformation in fetus, not applicable or unspecified: Secondary | ICD-10-CM | POA: Insufficient documentation

## 2014-05-19 ENCOUNTER — Encounter (HOSPITAL_COMMUNITY): Payer: Self-pay

## 2014-05-19 ENCOUNTER — Ambulatory Visit (HOSPITAL_COMMUNITY)
Admission: RE | Admit: 2014-05-19 | Discharge: 2014-05-19 | Disposition: A | Discharge status: Home / Self Care | Payer: 59 | Source: Ambulatory Visit | Attending: Obstetrics and Gynecology | Admitting: Obstetrics and Gynecology

## 2014-05-19 VITALS — BP 104/63 | HR 80 | Wt 183.5 lb

## 2014-05-19 DIAGNOSIS — O09519 Supervision of elderly primigravida, unspecified trimester: Secondary | ICD-10-CM | POA: Insufficient documentation

## 2014-05-19 DIAGNOSIS — O99519 Diseases of the respiratory system complicating pregnancy, unspecified trimester: Secondary | ICD-10-CM

## 2014-05-19 DIAGNOSIS — J45909 Unspecified asthma, uncomplicated: Secondary | ICD-10-CM

## 2014-05-19 DIAGNOSIS — O280 Abnormal hematological finding on antenatal screening of mother: Secondary | ICD-10-CM

## 2014-05-19 DIAGNOSIS — O289 Unspecified abnormal findings on antenatal screening of mother: Secondary | ICD-10-CM | POA: Diagnosis not present

## 2014-05-19 DIAGNOSIS — O09819 Supervision of pregnancy resulting from assisted reproductive technology, unspecified trimester: Secondary | ICD-10-CM | POA: Diagnosis not present

## 2014-05-19 DIAGNOSIS — O09899 Supervision of other high risk pregnancies, unspecified trimester: Secondary | ICD-10-CM

## 2014-05-19 DIAGNOSIS — Z9884 Bariatric surgery status: Secondary | ICD-10-CM

## 2014-06-04 ENCOUNTER — Encounter: Payer: 59 | Attending: Obstetrics and Gynecology

## 2014-06-04 VITALS — Ht 63.0 in | Wt 187.9 lb

## 2014-06-04 DIAGNOSIS — O99810 Abnormal glucose complicating pregnancy: Secondary | ICD-10-CM | POA: Diagnosis not present

## 2014-06-04 DIAGNOSIS — Z713 Dietary counseling and surveillance: Secondary | ICD-10-CM | POA: Diagnosis not present

## 2014-06-10 NOTE — Progress Notes (Signed)
  Patient was seen on 06/04/14 for Gestational Diabetes self-management . The following learning objectives were met by the patient :   States the definition of Gestational Diabetes  States why dietary management is important in controlling blood glucose  Describes the effects of carbohydrates on blood glucose levels  Demonstrates ability to create a balanced meal plan  Demonstrates carbohydrate counting   States when to check blood glucose levels  Demonstrates proper blood glucose monitoring techniques  States the effect of stress and exercise on blood glucose levels  States the importance of limiting caffeine and abstaining from alcohol and smoking  Plan:  Aim for 2 Carb Choices per meal (30 grams) +/- 1 either way for breakfast Aim for 3 Carb Choices per meal (45 grams) +/- 1 either way from lunch and dinner Aim for 1-2 Carbs per snack Begin reading food labels for Total Carbohydrate and sugar grams of foods Consider  increasing your activity level by walking daily as tolerated Begin checking BG before breakfast and 2 hours after first bit of breakfast, lunch and dinner after  as directed by MD  Take medication  as directed by MD  Blood glucose monitor given: One Touch Ultra 2 Lot # [id number] X Exp: 01/2015 Blood glucose reading:155m/dl  Patient instructed to monitor glucose levels: FBS: 60 - <90 2 hour: <120  Patient received the following handouts:  Nutrition Diabetes and Pregnancy  Carbohydrate Counting List  Meal Planning worksheet  Patient will be seen for follow-up as needed.

## 2014-06-16 ENCOUNTER — Ambulatory Visit (HOSPITAL_COMMUNITY): Payer: 59

## 2014-06-18 ENCOUNTER — Other Ambulatory Visit (HOSPITAL_COMMUNITY): Payer: Self-pay | Admitting: Maternal and Fetal Medicine

## 2014-06-18 DIAGNOSIS — O99280 Endocrine, nutritional and metabolic diseases complicating pregnancy, unspecified trimester: Secondary | ICD-10-CM

## 2014-06-18 DIAGNOSIS — J45909 Unspecified asthma, uncomplicated: Secondary | ICD-10-CM

## 2014-06-18 DIAGNOSIS — O9989 Other specified diseases and conditions complicating pregnancy, childbirth and the puerperium: Secondary | ICD-10-CM

## 2014-06-18 DIAGNOSIS — O3500X1 Maternal care for (suspected) central nervous system malformation or damage in fetus, unspecified, fetus 1: Secondary | ICD-10-CM

## 2014-06-18 DIAGNOSIS — E079 Disorder of thyroid, unspecified: Secondary | ICD-10-CM

## 2014-06-18 DIAGNOSIS — O09519 Supervision of elderly primigravida, unspecified trimester: Secondary | ICD-10-CM

## 2014-06-18 DIAGNOSIS — O350XX1 Maternal care for (suspected) central nervous system malformation in fetus, fetus 1: Secondary | ICD-10-CM

## 2014-06-23 ENCOUNTER — Ambulatory Visit (HOSPITAL_COMMUNITY): Payer: 59

## 2014-07-18 LAB — OB RESULTS CONSOLE GBS: STREP GROUP B AG: NEGATIVE

## 2014-08-01 IMAGING — RF DG HYSTEROGRAM
6 series · 6 of 6 positions shown · non-contrast
Comparison: none

CLINICAL DATA: Primary infertility.

HYSTEROSALPINGOGRAM
TECHNIQUE: Hysterosalpingogram was performed by the ordering
physician under fluoroscopy.  Fluoroscopic images are submitted for
interpretation following the procedure.
Fluoroscopy Time:  0.9 minutes.

[Series 1: run · 1 of 1 slices shown (1 of 6)]
[im 1/1]
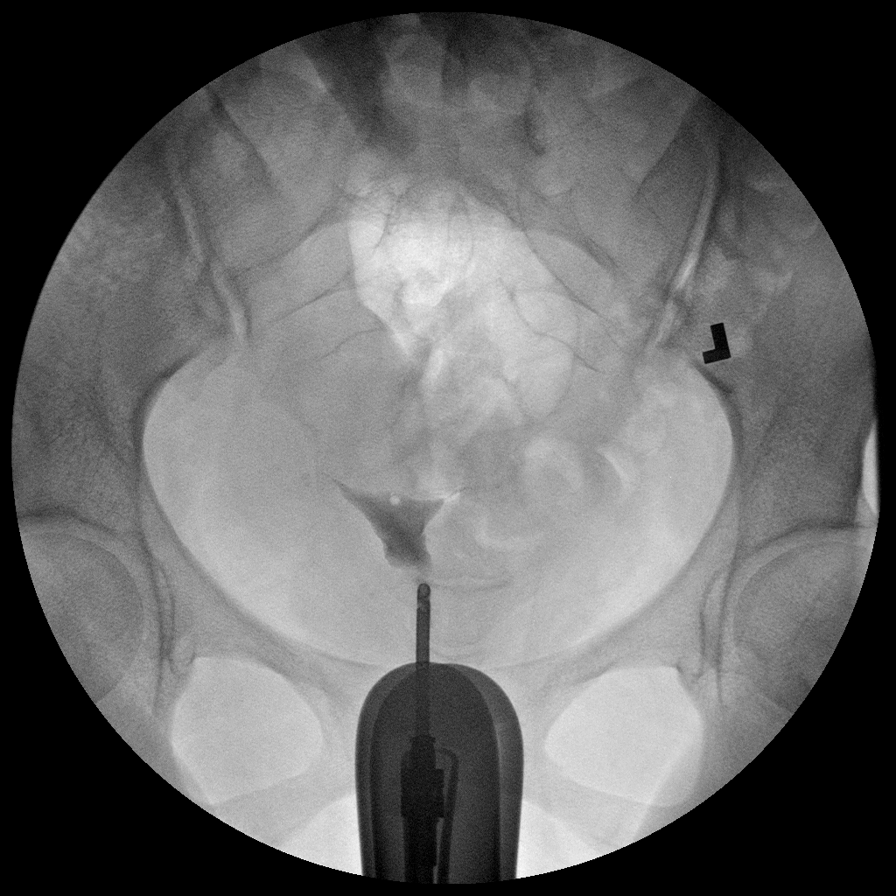

[Series 2: run · 1 of 1 slices shown (2 of 6)]
[im 1/1]
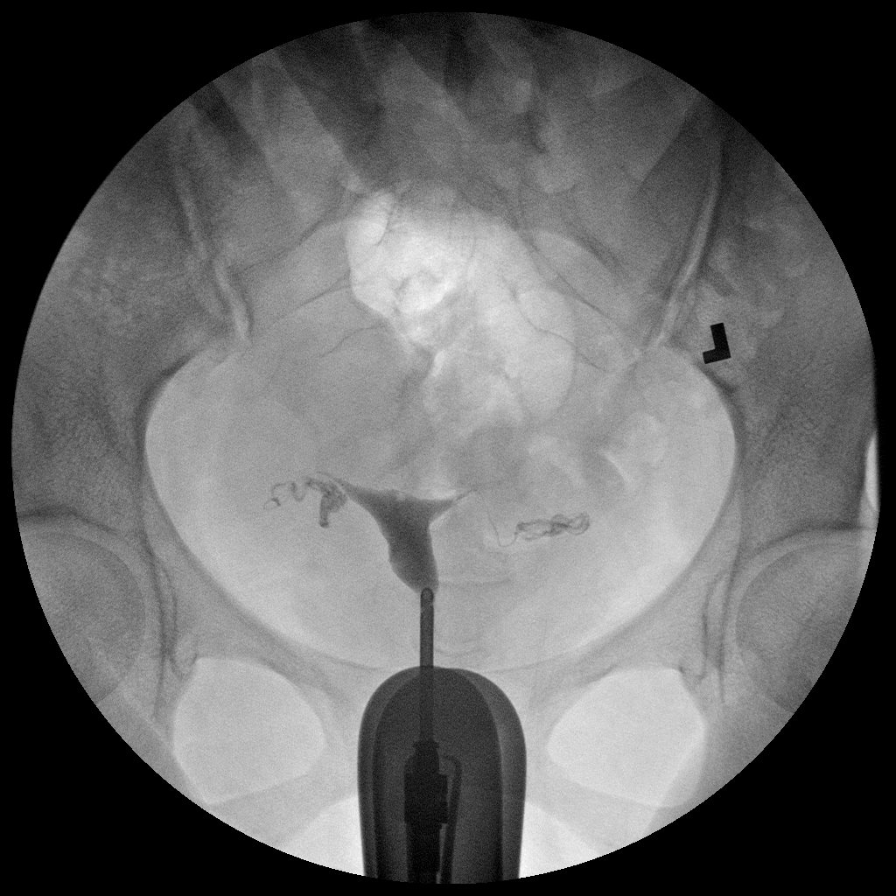

[Series 3: run · 1 of 1 slices shown (3 of 6)]
[im 1/1]
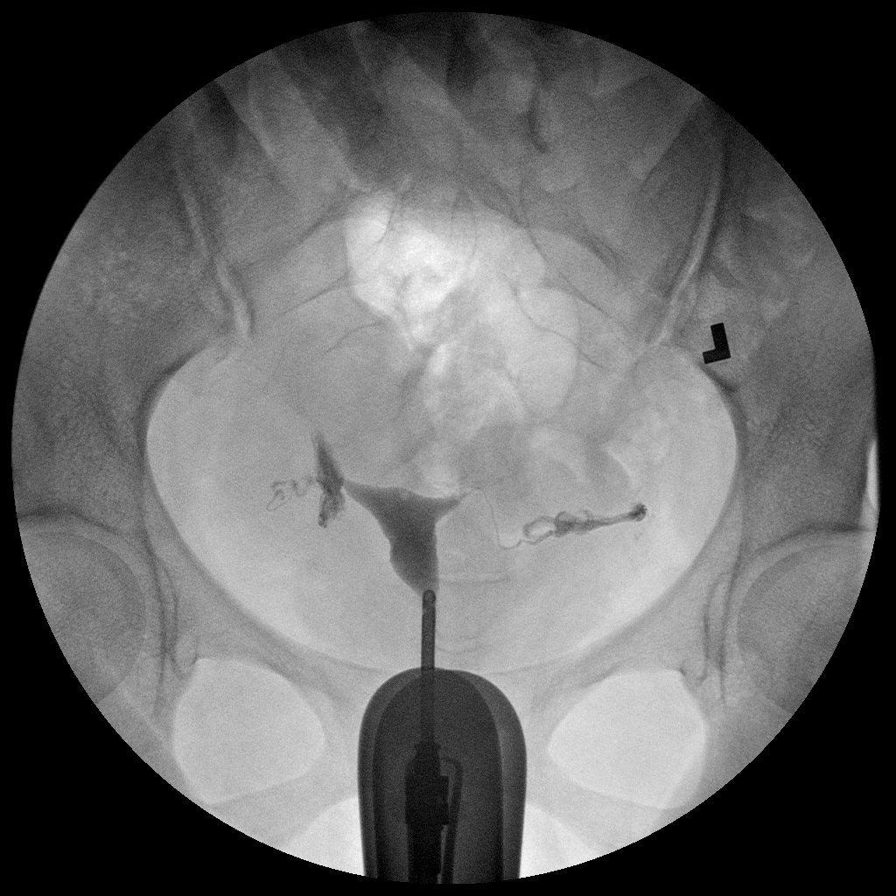

[Series 4: run · 1 of 1 slices shown (4 of 6)]
[im 1/1]
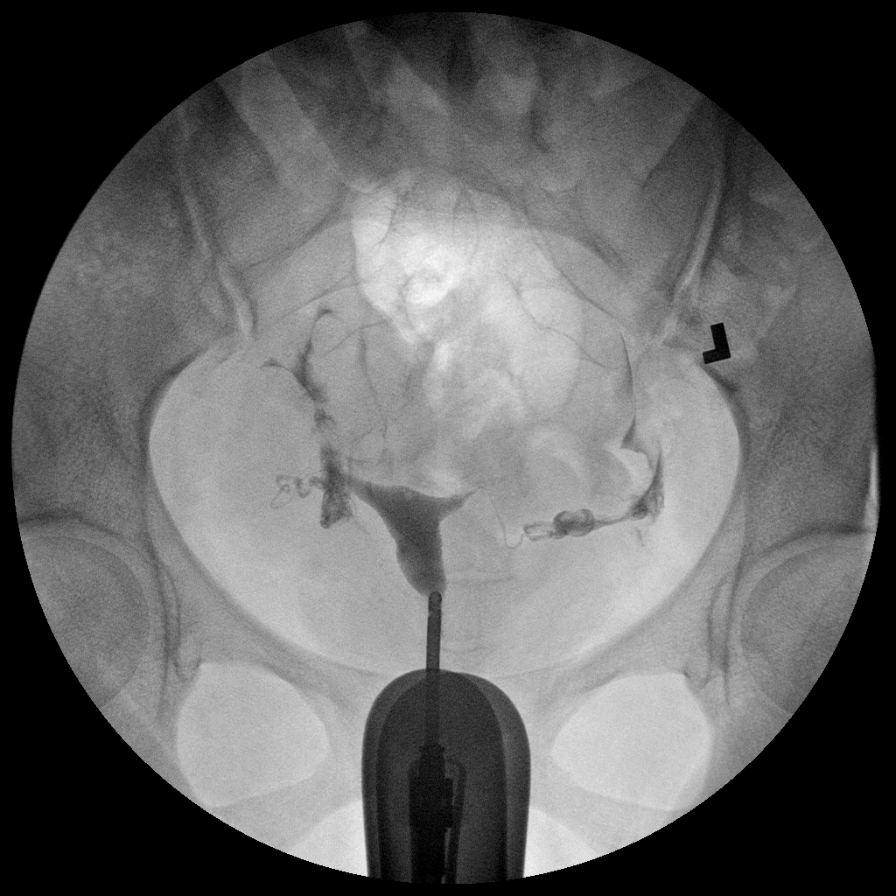

[Series 5: run · 1 of 1 slices shown (5 of 6)]
[im 1/1]
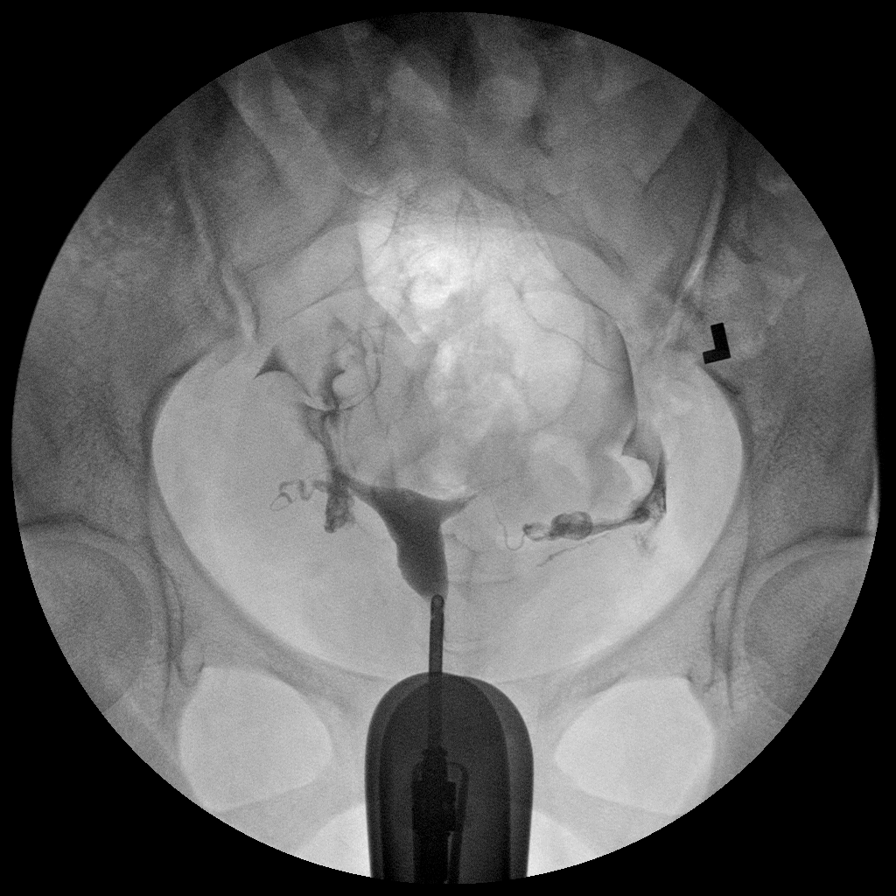

[Series 6: run · 1 of 1 slices shown (6 of 6)]
[im 1/1]
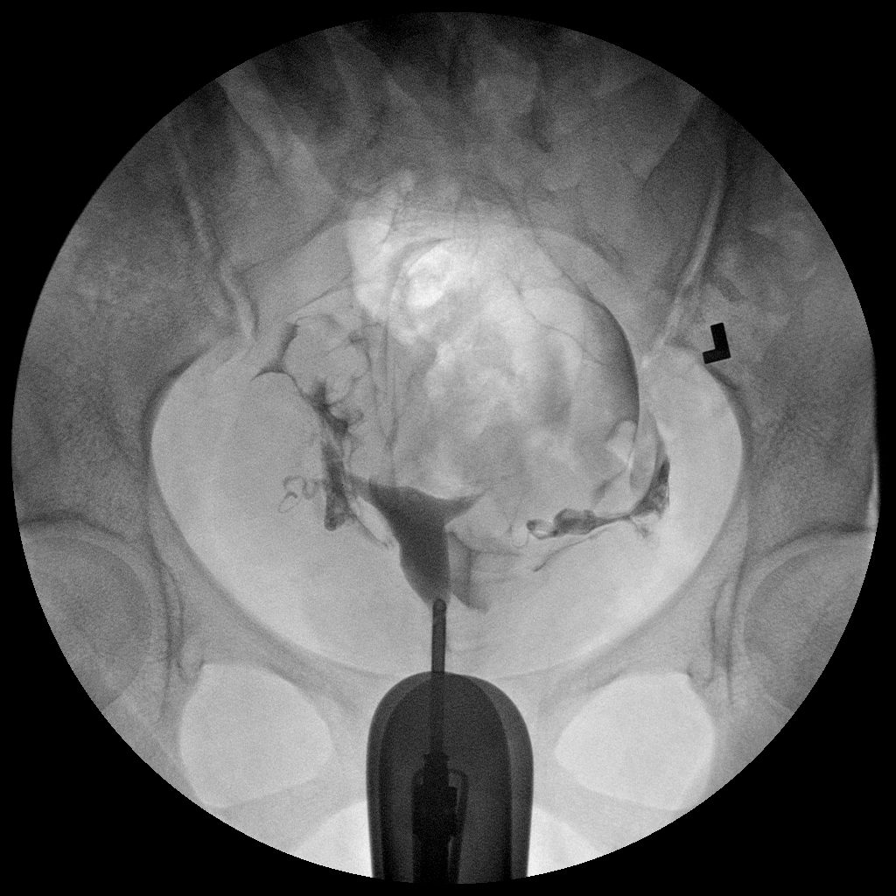

[6 of 6 positions shown; findings below may reference images not displayed]

FINDINGS: The endometrial cavity of the uterus is normal in contour
and appearance. No evidence of Mullerian duct anomaly.  A tiny
injected air bubble is noted in the fundus of the endometrial
cavity.

Contrast filling of both fallopian tubes is seen, and both tubes
are normal in appearance.  Intraperitoneal spill of the contrast
from both fallopian tubes is demonstrated.
IMPRESSION: Normal study.  Fallopian tubes are patent bilaterally.

## 2014-08-07 ENCOUNTER — Ambulatory Visit (INDEPENDENT_AMBULATORY_CARE_PROVIDER_SITE_OTHER): Payer: 59 | Admitting: Internal Medicine

## 2014-08-07 ENCOUNTER — Encounter: Payer: Self-pay | Admitting: Internal Medicine

## 2014-08-07 VITALS — BP 134/80 | HR 70 | Ht 63.0 in | Wt 214.0 lb

## 2014-08-07 DIAGNOSIS — O99519 Diseases of the respiratory system complicating pregnancy, unspecified trimester: Principal | ICD-10-CM

## 2014-08-07 DIAGNOSIS — O9989 Other specified diseases and conditions complicating pregnancy, childbirth and the puerperium: Secondary | ICD-10-CM

## 2014-08-07 DIAGNOSIS — J45909 Unspecified asthma, uncomplicated: Secondary | ICD-10-CM

## 2014-08-07 MED ORDER — ALBUTEROL SULFATE HFA 108 (90 BASE) MCG/ACT IN AERS: INHALATION_SPRAY | RESPIRATORY_TRACT | Status: AC

## 2014-08-07 NOTE — Patient Instructions (Addendum)
You have known severe reflux which is very likeley to be active now and contributing to your breathing / wheezing  - discuss with your ob/gyn  For any breathing problems first step  symbicort 160 Take 2 puffs first thing in am and then another 2 puffs about 12 hours later.     Only use your albuterol(proair) as a rescue medication to be used if you can't catch your breath by resting or doing a relaxed purse lip breathing pattern.  - The less you use it, the better it will work when you need it. - Ok to use up to 2 puffs  every 4 hours if you must but call for immediate appointment if use goes up over your usual need - Don't leave home without it !!  (think of it like the spare tire for your car)    GERD (REFLUX)  is an extremely common cause of respiratory symptoms just like yours , many times with no obvious heartburn at all.    It can be treated with medication, but also with lifestyle changes including avoidance of late meals, excessive alcohol, smoking cessation, and avoid fatty foods, chocolate, peppermint, colas, red wine, and acidic juices such as orange juice.  NO MINT OR MENTHOL PRODUCTS SO NO COUGH DROPS  USE SUGARLESS CANDY INSTEAD (Jolley ranchers or Stover's or Life Savers) or even ice chips will also do - the key is to swallow to prevent all throat clearing. NO OIL BASED VITAMINS - use powdered substitutes.    Return as needed

## 2014-08-07 NOTE — Progress Notes (Signed)
Subjective:    Patient ID: [name], female    DOB: 1976-09-16   MRN: [id number]    Brief patient profile:  38 yo [place] female grew up smokers and childhood asthma required daily inhalers moved to [place] to 2001 and then moved to [place] 2008 much better since then and stopped  Inhaler / then stopped smoking 2014 then started lupron 11/2013 then started chest tightness/ cough better with inhaler saba better p stopped lupron then worse again after started progresterone at [redacted] weeks gestation referred by Dr Linward Natalamachandra 01/07/14 to pulm clinic for dtc asthma   History of Present Illness  01/07/2014 1st [name]/ [name]   [redacted] weeks pregnant  Chief Complaint  Patient presents with  . Pulmonary Consult    Referred per Dr. Nicholos Johnsamachandran. Pt states that she has had asthm all of her life. She had been doing well with no need for rescue inhaler x 5 yrs, until 3 wks ago developed cough and dyspnea. She states that she is SOB all the time, esp worse at night. She wakes up several times every night with SOB. Her cough is occ prod with minimal clear sputum.   new problem severe min prod cough to point of vomiting and chest tightness better with neb bid xopenenx and hfa every hour but very concerned about exposing fetus to other meds. rec Plan A = automatic= symbicort 160 Take 2 puffs first thing in am and then another 2 puffs about 12 hours later.  Plan B = Backup for breathing  Only use your levoalbuterol as a rescue medication  If not better from levoalbuterol then ok to use nebulizer up to every 4 hours  Plan B for cough  If can't stop coughing > robitussin DM first and tylenol #3 every 4 hours as needed Plan C If all else fails to control your symptoms >  Prednisone 10 mg take  4 each am x 2 days,   2 each am x 2 days,  1 each am x 2 days and stop  GERD diet    01/22/2014 f/u ov/[name] re: asthma in pregnancy with documented GERD [redacted] weeks gestation Chief Complaint  Patient presents  with  . Follow-up    Pt states she has imporved. States cough is almost resolved. Pt states SOB has improved but intermittently becomes dyspneic with and without activity. Pt states overall she feels much better.   Not limited by breathing from desired activities  No need for saba at all, not using symbicort regularly rec Plan A = automatic= symbicort 160 Take 2 puffs first thing in am and then another 2 puffs about 12 hours later ok to adjust down but don't exceed 2 pffs every 12 hours as max dose  Plan B = Backup for breathing  Only use your levoalbuterol as a rescue medication  If not better from levoalbuterol then ok to use nebulizer up to every 4 hours and call right away for appointment   08/07/2014 f/u ov/[name] re: asthma in pregnancy / 39 week IUP Chief Complaint  Patient presents with  . Acute Visit    Pt c/o increased SOB x 2 wks. She also c/o wheezing at night. Denies any cough.  started taking symbicort 160 2 puffs at hs only  Sleeps on 2 pillows with active reflux  But refuses to take meds for it  Sob only with exertion/ not using saba     No obvious day to day or daytime variabilty or  assoc chronic cough or cp or chest tightness, subjective wheeze overt sinus or hb symptoms. No unusual exp hx or h/o childhood pna/ asthma or knowledge of premature birth.  Sleeping ok without nocturnal  or early am exacerbation  of respiratory  c/o's or need for noct saba. Also denies any obvious fluctuation of symptoms with weather or environmental changes or other aggravating or alleviating factors except as outlined above   Current Medications, Allergies, Complete Past Medical History, Past Surgical History, Family History, and Social History were reviewed in [facility] electronic medical record.  ROS  The following are not active complaints unless bolded sore throat, dysphagia, dental problems, itching, sneezing,  nasal congestion or excess/ purulent secretions, ear ache,   fever,  chills, sweats, unintended wt loss, pleuritic or exertional cp, hemoptysis,  orthopnea pnd or leg swelling, presyncope, palpitations, heartburn, abdominal pain, anorexia, nausea, vomiting, diarrhea  or change in bowel or urinary habits, change in stools or urine, dysuria,hematuria,  rash, arthralgias, visual complaints, headache, numbness weakness or ataxia or problems with walking or coordination,  change in mood/affect or memory.               Note h/o morbid obesity > gastric sleeve 10/2012 and documented severe GERD           Objective:   Physical Exam  amb [place] female nad  08/07/2014      215  Wt Readings from Last 3 Encounters:  01/07/14 172 lb 12.8 oz (78.382 kg)  05/15/13 195 lb 3.2 oz (88.542 kg)  05/14/13 195 lb 8 oz (88.678 kg)      HEENT: nl dentition, turbinates, and orophanx. Nl external ear canals without cough reflex   NECK :  without JVD/Nodes/TM/ nl carotid upstrokes bilaterally   LUNGS: no acc muscle use,   clear to A and P   CV:  RRR  no s3 or murmur or increase in P2, no edema   ABD:  soft and nontender with nl excursion in the supine position and c/w stated gestational age  No bruits or organomegaly, bowel sounds nl  MS:  warm without deformities, calf tenderness, cyanosis or clubbing  SKIN: warm and dry without lesions    NEURO:  alert, approp, no deficits     UGI 04/29/13  1. Significant spontaneous gastroesophageal reflux.      Assessment & Plan:

## 2014-08-08 ENCOUNTER — Inpatient Hospital Stay (HOSPITAL_COMMUNITY)
Admission: AD | Admit: 2014-08-08 | Discharge: 2014-08-08 | Disposition: A | Discharge status: Home / Self Care | Payer: 59 | Source: Ambulatory Visit | Attending: Obstetrics and Gynecology | Admitting: Obstetrics and Gynecology

## 2014-08-08 ENCOUNTER — Encounter (HOSPITAL_COMMUNITY): Payer: Self-pay | Admitting: *Deleted

## 2014-08-08 DIAGNOSIS — O4693 Antepartum hemorrhage, unspecified, third trimester: Secondary | ICD-10-CM | POA: Insufficient documentation

## 2014-08-08 DIAGNOSIS — Z3A39 39 weeks gestation of pregnancy: Secondary | ICD-10-CM | POA: Insufficient documentation

## 2014-08-08 HISTORY — DX: Gestational diabetes mellitus in pregnancy, unspecified control: O24.419

## 2014-08-08 NOTE — MAU Provider Note (Signed)
History     CSN: [id number]  Arrival date and time: 08/08/14 1744   First Provider Initiated Contact with Patient 08/08/14 1834      Chief Complaint  Patient presents with  . Vaginal Bleeding   HPI  [name] is a 38 y.o. G1P0 at 6540w0d who presents to MAU today with complaint of vaginal bleeding. The patient states that she had a small amount of blood earlier today. She states that it was bright red and more than spotting. She denies other vaginal discharge or LOF. She denies contractions or abdominal pain. The patient was checked in the office on Monday and was 2 cm per patient report. Her pregnancy course has been complicated by GDM. She reports good fetal movement.   OB History    Gravida Para Term Preterm AB TAB SAB Ectopic Multiple Living   1         0      Past Medical History  Diagnosis Date  . Hypothyroidism   . Polycystic ovarian syndrome   . Asthma     has rescue inhaler  . Obesity   . Hyperlipidemia   . Plantar fasciitis, bilateral   . Hypertension     r sept 2013 high readings- no meds  . Anemia   . Gestational diabetes mellitus, antepartum   . Gestational diabetes     Past Surgical History  Procedure Laterality Date  . Plantar fascia release    . Hysteroscopy w/d&c  06/01/2012    Procedure: DILATATION AND CURETTAGE /HYSTEROSCOPY;  Surgeon: Zelphia CairoGretchen Adkins, MD;  Location: [facility];  Service: Gynecology;  Laterality: N/A;  with truclear  . Cervical polypectomy      05/2012  . Planter Left sept 2012  . Planter    . Plantar fascia surgery  05/2011    left foot  . Plantar fascia surgery  04/2011    rt foot  . Laparoscopic gastric sleeve resection N/A 11/19/2012    Procedure: LAPAROSCOPIC GASTRIC SLEEVE RESECTION;  Surgeon: Lodema PilotBrian Layton, DO;  Location: [facility];  Service: General;  Laterality: N/A;  laparoscopic sleeve gastrectomy with EGD  . Esophagogastroduodenoscopy N/A 11/19/2012    Procedure: ESOPHAGOGASTRODUODENOSCOPY (EGD);  Surgeon: Lodema PilotBrian Layton,  DO;  Location: [facility];  Service: General;  Laterality: N/A;    Family History  Problem Relation Age of Onset  . Cancer Paternal Aunt     breast and colon  . Asthma Paternal Grandmother   . Heart disease Paternal Grandmother     History  Substance Use Topics  . Smoking status: Former Smoker -- 0.03 packs/day for .5 years    Types: Cigarettes    Quit date: 10/29/2012  . Smokeless tobacco: Never Used     Comment: smoked socially only   . Alcohol Use: No    Allergies: No Known Allergies  No prescriptions prior to admission    Review of Systems  Constitutional: Negative for fever and malaise/fatigue.  Gastrointestinal: Negative for abdominal pain.  Genitourinary:       + vaginal bleeding   Physical Exam   Blood pressure 134/104, pulse 83, temperature 98.8 F (37.1 C), temperature source Oral, resp. rate 18, last menstrual period 11/08/2013.  Physical Exam  Constitutional: She is oriented to person, place, and time. She appears well-developed and well-nourished. No distress.  HENT:  Head: Normocephalic.  Cardiovascular: Normal rate.   Respiratory: Effort normal.  GI: Soft. She exhibits no distension and no mass. There is no tenderness. There is no rebound  and no guarding.  Genitourinary: Uterus is enlarged (appropriate for GA). Cervix exhibits no motion tenderness, no discharge and no friability. No bleeding in the vagina. Tenderness: small amount of thin, white and mucus discharge noted. no pooling. Vaginal discharge found.  Neurological: She is alert and oriented to person, place, and time.  Skin: Skin is warm and dry. No erythema.  Psychiatric: She has a normal mood and affect.  Dilation: 1.5 Effacement (%): 20 Cervical Position: Posterior Presentation: Vertex Exam by:: J. Either, NP  Fetal Monitoring: Baseline: 120 bpm, moderate variability, + accelerations, no decelarations Contractions: none  MAU Course  Procedures None  MDM Fern - negative Discussed  patient with Dr. Arelia SneddonMcComb. Ok for discharge with labor precautions.   Assessment and Plan  A: SIUP at 2359w0d Vaginal bleeding in pregnancy, third trimester  P: Discharge home Bleeding and labor precautions discussed Patient advised to follow-up with Dr. Arelia SneddonMcComb as scheduled for routine prenatal care or sooner if symptoms worsen Patient may return to MAU as needed or if her condition were to change or worsen  Marny LowensteinJulie N Wenzel, PA-C  08/08/2014, 7:37 PM

## 2014-08-08 NOTE — MAU Note (Addendum)
Had a little bleeding today. Little more than just when wiped. No pain. Just happened the one time. No mucous.  Like beginning of period. No pain.  Cervix checked on Monday, was 2 cm.

## 2014-08-08 NOTE — Discharge Instructions (Signed)
Pelvic Rest °Pelvic rest is sometimes recommended for women when:  °· The placenta is partially or completely covering the opening of the cervix (placenta previa). °· There is bleeding between the uterine wall and the amniotic sac in the first trimester (subchorionic hemorrhage). °· The cervix begins to open without labor starting (incompetent cervix, cervical insufficiency). °· The labor is too early (preterm labor). °HOME CARE INSTRUCTIONS °· Do not have sexual intercourse, stimulation, or an orgasm. °· Do not use tampons, douche, or put anything in the vagina. °· Do not lift anything over 10 pounds (4.5 kg). °· Avoid strenuous activity or straining your pelvic muscles. °SEEK MEDICAL CARE IF:  °· You have any vaginal bleeding during pregnancy. Treat this as a potential emergency. °· You have cramping pain felt low in the stomach (stronger than menstrual cramps). °· You notice vaginal discharge (watery, mucus, or bloody). °· You have a low, dull backache. °· There are regular contractions or uterine tightening. °SEEK IMMEDIATE MEDICAL CARE IF: °You have vaginal bleeding and have placenta previa.  °Document Released: 01/07/2011 Document Revised: 12/05/2011 Document Reviewed: 01/07/2011 °ExitCare® Patient Information ©2015 [employer]. This information is not intended to replace advice given to you by your health care provider. Make sure you discuss any questions you have with your health care provider. ° °Vaginal Bleeding During Pregnancy, Third Trimester °A small amount of bleeding (spotting) from the vagina is relatively common in pregnancy. Various things can cause bleeding or spotting in pregnancy. Sometimes the bleeding is normal and is not a problem. However, bleeding during the third trimester can also be a sign of something serious for the mother and the baby. Be sure to tell your health care provider about any vaginal bleeding right away.  °Some possible causes of vaginal bleeding during the third  trimester include:  °· The placenta may be partially or completely covering the opening to the cervix (placenta previa).   °· The placenta may have separated from the uterus (abruption of the placenta).   °· There may be an infection or growth on the cervix.   °· You may be starting labor, called discharging of the mucus plug.   °· The placenta may grow into the muscle layer of the uterus (placenta accreta).   °HOME CARE INSTRUCTIONS  °Watch your condition for any changes. The following actions may help to lessen any discomfort you are feeling:  °· Follow your health care provider's instructions for limiting your activity. If your health care provider orders bed rest, you may need to stay in bed and only get up to use the bathroom. However, your health care provider may allow you to continue light activity. °· If needed, make plans for someone to help with your regular activities and responsibilities while you are on bed rest. °· Keep track of the number of pads you use each day, how often you change pads, and how soaked (saturated) they are. Write this down. °· Do not use tampons. Do not douche. °· Do not have sexual intercourse or orgasms until approved by your health care provider. °· Follow your health care provider's advice about lifting, driving, and physical activities. °· If you pass any tissue from your vagina, save the tissue so you can show it to your health care provider.   °· Only take over-the-counter or prescription medicines as directed by your health care provider. °· Do not take aspirin because it can make you bleed.   °· Keep all follow-up appointments as directed by your health care provider. °SEEK MEDICAL CARE IF: °·   You have any vaginal bleeding during any part of your pregnancy. °· You have cramps or labor pains. °· You have a fever, not controlled by medicine. °SEEK IMMEDIATE MEDICAL CARE IF:  °· You have severe cramps or pain in your back or belly (abdomen). °· You have chills. °· You have a  gush of fluid from the vagina. °· You pass large clots or tissue from your vagina. °· Your bleeding increases. °· You feel light-headed or weak. °· You pass out. °· You feel less movement or no movement of the baby.   °MAKE SURE YOU: °· Understand these instructions. °· Will watch your condition. °· Will get help right away if you are not doing well or get worse. °Document Released: 12/03/2002 Document Revised: 09/17/2013 Document Reviewed: 05/20/2013 °ExitCare® Patient Information ©2015 [employer]. This information is not intended to replace advice given to you by your health care provider. Make sure you discuss any questions you have with your health care provider. ° °

## 2014-08-09 ENCOUNTER — Encounter: Payer: Self-pay | Admitting: Internal Medicine

## 2014-08-09 NOTE — Assessment & Plan Note (Signed)
-   spirometry 12/03/13 FEV1 1.41 > 2.05 p B2 with ratio 42% but very abn f/v loop  - 01/22/2014 p extensive coaching HFA effectiveness =    90&   DDX of  difficult airways management all start with A and  include Adherence, Ace Inhibitors, Acid Reflux, Active Sinus Disease, Alpha 1 Antitripsin deficiency, Anxiety masquerading as Airways dz,  ABPA,  allergy(esp in young), Aspiration (esp in elderly), Adverse effects of DPI,  Active smokers, plus two Bs  = Bronchiectasis and Beta blocker use..and one C= CHF  Adherence is always the initial "prime suspect" and is a multilayered concern that requires a "trust but verify" approach in every patient - starting with knowing how to use medications, especially inhalers, correctly, keeping up with refills and understanding the fundamental difference between maintenance and prns vs those medications only taken for a very short course and then stopped and not refilled.  - The proper method of use, as well as anticipated side effects, of a metered-dose inhaler are discussed and demonstrated to the patient. Improved effectiveness after extensive coaching during this visit to a level of approximately  90% so rec symbicort 160 Take 2 puffs first thing in am and then another 2 puffs about 12 hours later.     Discussed in detail all the  indications, usual  risks and alternatives  relative to the benefits with patient re meds for asthma/ gerd vs poorly controlled sob in terms of 02 delivery to the baby but she declined any escalation of care beyond max symbicort and promised to return pos partum to discuss longterm management.

## 2014-08-10 ENCOUNTER — Encounter (HOSPITAL_COMMUNITY): Payer: Self-pay | Admitting: *Deleted

## 2014-08-10 ENCOUNTER — Inpatient Hospital Stay (HOSPITAL_COMMUNITY)
Admission: AD | Admit: 2014-08-10 | Discharge: 2014-08-10 | Disposition: A | Discharge status: Home / Self Care | Payer: 59 | Source: Ambulatory Visit | Attending: Obstetrics and Gynecology | Admitting: Obstetrics and Gynecology

## 2014-08-10 DIAGNOSIS — O1403 Mild to moderate pre-eclampsia, third trimester: Secondary | ICD-10-CM

## 2014-08-10 DIAGNOSIS — Z87891 Personal history of nicotine dependence: Secondary | ICD-10-CM | POA: Insufficient documentation

## 2014-08-10 DIAGNOSIS — Z3A39 39 weeks gestation of pregnancy: Secondary | ICD-10-CM | POA: Insufficient documentation

## 2014-08-10 LAB — URINALYSIS, ROUTINE W REFLEX MICROSCOPIC
Bilirubin Urine: NEGATIVE
GLUCOSE, UA: 100 mg/dL — AB
KETONES UR: NEGATIVE mg/dL
Nitrite: NEGATIVE
PROTEIN: 30 mg/dL — AB
Specific Gravity, Urine: 1.025 (ref 1.005–1.030)
UROBILINOGEN UA: 0.2 mg/dL (ref 0.0–1.0)
pH: 6 (ref 5.0–8.0)

## 2014-08-10 LAB — URINE MICROSCOPIC-ADD ON

## 2014-08-10 LAB — CBC
HCT: 33.5 % — ABNORMAL LOW (ref 36.0–46.0)
Hemoglobin: 11.1 g/dL — ABNORMAL LOW (ref 12.0–15.0)
MCH: 25.5 pg — ABNORMAL LOW (ref 26.0–34.0)
MCHC: 33.1 g/dL (ref 30.0–36.0)
MCV: 76.8 fL — ABNORMAL LOW (ref 78.0–100.0)
Platelets: 169 10*3/uL (ref 150–400)
RBC: 4.36 MIL/uL (ref 3.87–5.11)
RDW: 12.9 % (ref 11.5–15.5)
WBC: 10.2 10*3/uL (ref 4.0–10.5)

## 2014-08-10 LAB — COMPREHENSIVE METABOLIC PANEL
ALT: 7 U/L (ref 0–35)
AST: 14 U/L (ref 0–37)
Albumin: 2.4 g/dL — ABNORMAL LOW (ref 3.5–5.2)
Alkaline Phosphatase: 186 U/L — ABNORMAL HIGH (ref 39–117)
Anion gap: 10 (ref 5–15)
BUN: 15 mg/dL (ref 6–23)
CO2: 23 mEq/L (ref 19–32)
Calcium: 8.3 mg/dL — ABNORMAL LOW (ref 8.4–10.5)
Chloride: 105 mEq/L (ref 96–112)
Creatinine, Ser: 0.73 mg/dL (ref 0.50–1.10)
GFR calc Af Amer: >90 mL/min (ref >90)
GFR calc non Af Amer: >90 mL/min (ref >90)
Glucose, Bld: 85 mg/dL (ref 70–99)
Potassium: 4.3 mEq/L (ref 3.7–5.3)
Sodium: 138 mEq/L (ref 137–147)
Total Bilirubin: <0.2 mg/dL — ABNORMAL LOW (ref 0.3–1.2)
Total Protein: 5.5 g/dL — ABNORMAL LOW (ref 6.0–8.3)

## 2014-08-10 LAB — LACTATE DEHYDROGENASE: LDH: 194 U/L (ref 94–250)

## 2014-08-10 LAB — PROTEIN / CREATININE RATIO, URINE
Creatinine, Urine: 99.77 mg/dL
Protein Creatinine Ratio: 0.66 — ABNORMAL HIGH (ref 0.00–0.15)
Total Protein, Urine: 65.5 mg/dL

## 2014-08-10 LAB — URIC ACID: Uric Acid, Serum: 5 mg/dL (ref 2.4–7.0)

## 2014-08-10 NOTE — MAU Provider Note (Signed)
History     CSN: [id number]  Arrival date and time: 08/10/14 [id number]   First Provider Initiated Contact with Patient 08/10/14 1900      Chief Complaint  Patient presents with  . Hypertension   HPI  [name] is a 38 y.o. G1P0 at 7240w2d. She presents with elevated B/P. She took it in a drugstore last evening, was sl elevated. She took at home x 2, was 161/104. She has slight headache, has had visual changes this week. No epigastric pain. Her hands and feet are swelling.  No contractions, leaking or bleeding. Good fetal activity.  Next appt 11/17.  OB History    Gravida Para Term Preterm AB TAB SAB Ectopic Multiple Living   1         0      Past Medical History  Diagnosis Date  . Hypothyroidism   . Polycystic ovarian syndrome   . Asthma     has rescue inhaler  . Obesity   . Hyperlipidemia   . Plantar fasciitis, bilateral   . Hypertension     r sept 2013 high readings- no meds  . Anemia   . Gestational diabetes mellitus, antepartum   . Gestational diabetes     Past Surgical History  Procedure Laterality Date  . Plantar fascia release    . Hysteroscopy w/d&c  06/01/2012    Procedure: DILATATION AND CURETTAGE /HYSTEROSCOPY;  Surgeon: Zelphia CairoGretchen Adkins, MD;  Location: [facility];  Service: Gynecology;  Laterality: N/A;  with truclear  . Cervical polypectomy      05/2012  . Planter Left sept 2012  . Planter    . Plantar fascia surgery  05/2011    left foot  . Plantar fascia surgery  04/2011    rt foot  . Laparoscopic gastric sleeve resection N/A 11/19/2012    Procedure: LAPAROSCOPIC GASTRIC SLEEVE RESECTION;  Surgeon: Lodema PilotBrian Layton, DO;  Location: [facility];  Service: General;  Laterality: N/A;  laparoscopic sleeve gastrectomy with EGD  . Esophagogastroduodenoscopy N/A 11/19/2012    Procedure: ESOPHAGOGASTRODUODENOSCOPY (EGD);  Surgeon: Lodema PilotBrian Layton, DO;  Location: [facility];  Service: General;  Laterality: N/A;    Family History  Problem Relation Age of Onset  . Cancer Paternal  Aunt     breast and colon  . Asthma Paternal Grandmother   . Heart disease Paternal Grandmother     History  Substance Use Topics  . Smoking status: Former Smoker -- 0.03 packs/day for .5 years    Types: Cigarettes    Quit date: 10/29/2012  . Smokeless tobacco: Never Used     Comment: smoked socially only   . Alcohol Use: No    Allergies: No Known Allergies  Prescriptions prior to admission  Medication Sig Dispense Refill Last Dose  . budesonide-formoterol (SYMBICORT) 160-4.5 MCG/ACT inhaler Inhale 2 puffs into the lungs every morning. (Patient taking differently: Inhale 2 puffs into the lungs at bedtime. ) 1 Inhaler 5 08/09/2014 at Unknown time  . Prenatal Vit-Fe Fumarate-FA (PRENATAL MULTIVITAMIN) TABS tablet Take 1 tablet by mouth at bedtime.   08/09/2014 at Unknown time  . thyroid (ARMOUR) 90 MG tablet Take 90 mg by mouth daily before breakfast.    08/10/2014 at Unknown time  . albuterol (PROAIR HFA) 108 (90 BASE) MCG/ACT inhaler 2 puffs every 4 hours as needed only  if your can't catch your breath 1 Inhaler 1 More than a month at Unknown time    Review of Systems  Constitutional: Negative for fever and  chills.  Eyes:       "floaters" this week  Gastrointestinal: Negative for nausea, vomiting, abdominal pain, diarrhea and constipation.  Genitourinary: Negative for dysuria, urgency and frequency.  Musculoskeletal:       Swelling in hands and feet  Neurological:       Headache   Physical Exam   Blood pressure 142/91, pulse 80, resp. rate 16, height 5\' 3"  (1.6 m), weight 92.987 kg (205 lb), last menstrual period 11/08/2013.  Physical Exam  Vitals reviewed. Constitutional: She is oriented to person, place, and time. She appears well-developed and well-nourished.  Musculoskeletal: She exhibits edema.  1+ bil pitting edema bil feet  Neurological: She is alert and oriented to person, place, and time. She has normal reflexes.  Skin: Skin is warm and dry.  Psychiatric: She  has a normal mood and affect. Her behavior is normal.    MAU Course  Procedures  MDM Results for orders placed or performed during the hospital encounter of 08/10/14 (from the past 24 hour(s))  Urinalysis, Routine w reflex microscopic     Status: Abnormal   Collection Time: 08/10/14  6:06 PM  Result Value Ref Range   Color, Urine YELLOW YELLOW   APPearance CLEAR CLEAR   Specific Gravity, Urine 1.025 1.005 - 1.030   pH 6.0 5.0 - 8.0   Glucose, UA 100 (A) NEGATIVE mg/dL   Hgb urine dipstick TRACE (A) NEGATIVE   Bilirubin Urine NEGATIVE NEGATIVE   Ketones, ur NEGATIVE NEGATIVE mg/dL   Protein, ur 30 (A) NEGATIVE mg/dL   Urobilinogen, UA 0.2 0.0 - 1.0 mg/dL   Nitrite NEGATIVE NEGATIVE   Leukocytes, UA TRACE (A) NEGATIVE  Urine microscopic-add on     Status: Abnormal   Collection Time: 08/10/14  6:06 PM  Result Value Ref Range   Squamous Epithelial / LPF FEW (A) RARE   WBC, UA 7-10 <3 WBC/hpf   RBC / HPF 3-6 <3 RBC/hpf   Bacteria, UA FEW (A) RARE  Protein / creatinine ratio, urine     Status: Abnormal   Collection Time: 08/10/14  6:06 PM  Result Value Ref Range   Creatinine, Urine 99.77 mg/dL   Total Protein, Urine 65.5 mg/dL   Protein Creatinine Ratio 0.66 (H) 0.00 - 0.15  CBC     Status: Abnormal   Collection Time: 08/10/14  7:10 PM  Result Value Ref Range   WBC 10.2 4.0 - 10.5 K/uL   RBC 4.36 3.87 - 5.11 MIL/uL   Hemoglobin 11.1 (L) 12.0 - 15.0 g/dL   HCT 16.133.5 (L) 09.636.0 - 04.546.0 %   MCV 76.8 (L) 78.0 - 100.0 fL   MCH 25.5 (L) 26.0 - 34.0 pg   MCHC 33.1 30.0 - 36.0 g/dL   RDW 40.912.9 81.111.5 - 91.415.5 %   Platelets 169 150 - 400 K/uL  Comprehensive metabolic panel     Status: Abnormal (Preliminary result)   Collection Time: 08/10/14  7:10 PM  Result Value Ref Range   Sodium 138 137 - 147 mEq/L   Potassium 4.3 3.7 - 5.3 mEq/L   Chloride 105 96 - 112 mEq/L   CO2 23 19 - 32 mEq/L   Glucose, Bld 85 70 - 99 mg/dL   BUN 15 6 - 23 mg/dL   Creatinine, Ser 7.820.73 0.50 - 1.10 mg/dL    Calcium 8.3 (L) 8.4 - 10.5 mg/dL   Total Protein 5.5 (L) 6.0 - 8.3 g/dL   Albumin 2.4 (L) 3.5 - 5.2 g/dL   AST  14 0 - 37 U/L   ALT 7 0 - 35 U/L   Alkaline Phosphatase PENDING 39 - 117 U/L   Total Bilirubin <0.2 (L) 0.3 - 1.2 mg/dL   GFR calc non Af Amer >90 >90 mL/min   GFR calc Af Amer >90 >90 mL/min   Anion gap 10 5 - 15  Uric acid     Status: None   Collection Time: 08/10/14  7:10 PM  Result Value Ref Range   Uric Acid, Serum 5.0 2.4 - 7.0 mg/dL  Lactate dehydrogenase     Status: None   Collection Time: 08/10/14  7:10 PM  Result Value Ref Range   LDH 194 94 - 250 U/L     Assessment and Plan  Mild  Pre eclampsia  Consulted with Dr Arelia Sneddon Pt to call the office in the am to be seen to schedule induction Call the office with changes in symptoms- precautions reviewed  [name] M. 08/10/2014, 7:01 PM

## 2014-08-10 NOTE — MAU Note (Signed)
G1P0 at 39 weeks, comes in c/o elevated BP at home 161/104.  Pt declines any headache or blurred vision today but said she had some "floaters" this week.  No  Right upper quadrant pain, hyperreflexia or clonus.  Pt does have bilat edema in feet and hands which she said she has had over the past two weeks.

## 2014-08-10 NOTE — Discharge Instructions (Signed)

## 2014-08-11 ENCOUNTER — Encounter (HOSPITAL_COMMUNITY): Payer: Self-pay | Admitting: *Deleted

## 2014-08-11 ENCOUNTER — Inpatient Hospital Stay (HOSPITAL_COMMUNITY)
Admission: AD | Admit: 2014-08-11 | Discharge: 2014-08-16 | DRG: 766 | Disposition: A | Discharge status: Home / Self Care | Payer: 59 | Source: Ambulatory Visit | Attending: Obstetrics and Gynecology | Admitting: Obstetrics and Gynecology

## 2014-08-11 DIAGNOSIS — J45909 Unspecified asthma, uncomplicated: Secondary | ICD-10-CM | POA: Diagnosis present

## 2014-08-11 DIAGNOSIS — Z87891 Personal history of nicotine dependence: Secondary | ICD-10-CM | POA: Diagnosis not present

## 2014-08-11 DIAGNOSIS — O09513 Supervision of elderly primigravida, third trimester: Secondary | ICD-10-CM | POA: Diagnosis not present

## 2014-08-11 DIAGNOSIS — O324XX0 Maternal care for high head at term, not applicable or unspecified: Secondary | ICD-10-CM | POA: Diagnosis present

## 2014-08-11 DIAGNOSIS — E039 Hypothyroidism, unspecified: Secondary | ICD-10-CM | POA: Diagnosis present

## 2014-08-11 DIAGNOSIS — O9952 Diseases of the respiratory system complicating childbirth: Secondary | ICD-10-CM | POA: Diagnosis present

## 2014-08-11 DIAGNOSIS — O24420 Gestational diabetes mellitus in childbirth, diet controlled: Secondary | ICD-10-CM | POA: Diagnosis present

## 2014-08-11 DIAGNOSIS — Z3A39 39 weeks gestation of pregnancy: Secondary | ICD-10-CM | POA: Diagnosis present

## 2014-08-11 DIAGNOSIS — O99284 Endocrine, nutritional and metabolic diseases complicating childbirth: Secondary | ICD-10-CM | POA: Diagnosis present

## 2014-08-11 DIAGNOSIS — E785 Hyperlipidemia, unspecified: Secondary | ICD-10-CM | POA: Diagnosis present

## 2014-08-11 DIAGNOSIS — O1490 Unspecified pre-eclampsia, unspecified trimester: Secondary | ICD-10-CM | POA: Diagnosis present

## 2014-08-11 DIAGNOSIS — O113 Pre-existing hypertension with pre-eclampsia, third trimester: Secondary | ICD-10-CM | POA: Diagnosis present

## 2014-08-11 DIAGNOSIS — O1493 Unspecified pre-eclampsia, third trimester: Secondary | ICD-10-CM | POA: Diagnosis present

## 2014-08-11 LAB — GLUCOSE, CAPILLARY
GLUCOSE-CAPILLARY: 66 mg/dL — AB (ref 70–99)
Glucose-Capillary: 120 mg/dL — ABNORMAL HIGH (ref 70–99)

## 2014-08-11 LAB — COMPREHENSIVE METABOLIC PANEL
ALBUMIN: 2.4 g/dL — AB (ref 3.5–5.2)
ALK PHOS: 191 U/L — AB (ref 39–117)
ALT: 8 U/L (ref 0–35)
ANION GAP: 11 (ref 5–15)
AST: 14 U/L (ref 0–37)
BUN: 13 mg/dL (ref 6–23)
CHLORIDE: 105 meq/L (ref 96–112)
CO2: 21 mEq/L (ref 19–32)
Calcium: 8.4 mg/dL (ref 8.4–10.5)
Creatinine, Ser: 0.64 mg/dL (ref 0.50–1.10)
GFR calc Af Amer: >90 mL/min (ref >90)
GFR calc non Af Amer: >90 mL/min (ref >90)
Glucose, Bld: 99 mg/dL (ref 70–99)
Potassium: 4.2 mEq/L (ref 3.7–5.3)
SODIUM: 137 meq/L (ref 137–147)
Total Protein: 5.5 g/dL — ABNORMAL LOW (ref 6.0–8.3)

## 2014-08-11 LAB — CBC
HEMATOCRIT: 32.5 % — AB (ref 36.0–46.0)
Hemoglobin: 10.9 g/dL — ABNORMAL LOW (ref 12.0–15.0)
MCH: 25.8 pg — ABNORMAL LOW (ref 26.0–34.0)
MCHC: 33.5 g/dL (ref 30.0–36.0)
MCV: 76.8 fL — ABNORMAL LOW (ref 78.0–100.0)
Platelets: 164 10*3/uL (ref 150–400)
RBC: 4.23 MIL/uL (ref 3.87–5.11)
RDW: 13 % (ref 11.5–15.5)
WBC: 8.3 10*3/uL (ref 4.0–10.5)

## 2014-08-11 LAB — TYPE AND SCREEN
ABO/RH(D): B POS
ANTIBODY SCREEN: NEGATIVE

## 2014-08-11 LAB — MAGNESIUM: MAGNESIUM: 4.5 mg/dL — AB (ref 1.5–2.5)

## 2014-08-11 LAB — ABO/RH: ABO/RH(D): B POS

## 2014-08-11 LAB — URIC ACID: Uric Acid, Serum: 4.9 mg/dL (ref 2.4–7.0)

## 2014-08-11 MED ORDER — LIDOCAINE HCL (PF) 1 % IJ SOLN
30.0000 mL | INTRAMUSCULAR | Status: DC | PRN
  Filled 2014-08-11: qty 30

## 2014-08-11 MED ORDER — OXYTOCIN BOLUS FROM INFUSION: 500.0000 mL | INTRAVENOUS | Status: DC

## 2014-08-11 MED ORDER — MISOPROSTOL 25 MCG QUARTER TABLET
25.0000 ug | ORAL_TABLET | ORAL | Status: DC | PRN
  Administered 2014-08-11 – 2014-08-12 (×3): 25 ug via VAGINAL
  Filled 2014-08-11 (×3): qty 0.25

## 2014-08-11 MED ORDER — MAGNESIUM SULFATE 40 G IN LACTATED RINGERS - SIMPLE
2.0000 g/h | INTRAVENOUS | Status: DC
  Administered 2014-08-12: 2 g/h via INTRAVENOUS
  Filled 2014-08-11 (×2): qty 500

## 2014-08-11 MED ORDER — THYROID 60 MG PO TABS
90.0000 mg | ORAL_TABLET | Freq: Every day | ORAL | Status: DC
  Filled 2014-08-11 (×2): qty 1

## 2014-08-11 MED ORDER — ZOLPIDEM TARTRATE 5 MG PO TABS: 5.0000 mg | ORAL_TABLET | Freq: Every evening | ORAL | Status: DC | PRN

## 2014-08-11 MED ORDER — TERBUTALINE SULFATE 1 MG/ML IJ SOLN: 0.2500 mg | Freq: Once | INTRAMUSCULAR | Status: AC | PRN

## 2014-08-11 MED ORDER — OXYTOCIN 40 UNITS IN LACTATED RINGERS INFUSION - SIMPLE MED: 62.5000 mL/h | INTRAVENOUS | Status: DC

## 2014-08-11 MED ORDER — ONDANSETRON HCL 4 MG/2ML IJ SOLN: 4.0000 mg | Freq: Four times a day (QID) | INTRAMUSCULAR | Status: DC | PRN

## 2014-08-11 MED ORDER — LACTATED RINGERS IV SOLN
INTRAVENOUS | Status: DC
  Administered 2014-08-11 – 2014-08-12 (×4): via INTRAVENOUS

## 2014-08-11 MED ORDER — OXYCODONE-ACETAMINOPHEN 5-325 MG PO TABS
1.0000 | ORAL_TABLET | ORAL | Status: DC | PRN
Start: 2014-08-11 — End: 2014-08-13

## 2014-08-11 MED ORDER — FLEET ENEMA 7-19 GM/118ML RE ENEM: 1.0000 | ENEMA | RECTAL | Status: DC | PRN

## 2014-08-11 MED ORDER — ALBUTEROL SULFATE (2.5 MG/3ML) 0.083% IN NEBU: INHALATION_SOLUTION | Freq: Four times a day (QID) | RESPIRATORY_TRACT | Status: DC

## 2014-08-11 MED ORDER — OXYCODONE-ACETAMINOPHEN 5-325 MG PO TABS
2.0000 | ORAL_TABLET | ORAL | Status: DC | PRN
Start: 2014-08-11 — End: 2014-08-13

## 2014-08-11 MED ORDER — ACETAMINOPHEN 325 MG PO TABS
650.0000 mg | ORAL_TABLET | ORAL | Status: DC | PRN
  Administered 2014-08-12: 650 mg via ORAL
  Filled 2014-08-11: qty 2

## 2014-08-11 MED ORDER — CITRIC ACID-SODIUM CITRATE 334-500 MG/5ML PO SOLN
30.0000 mL | ORAL | Status: DC | PRN
  Administered 2014-08-13: 30 mL via ORAL
  Filled 2014-08-11 (×2): qty 15

## 2014-08-11 MED ORDER — MAGNESIUM SULFATE BOLUS VIA INFUSION
4.0000 g | Freq: Once | INTRAVENOUS | Status: AC
  Administered 2014-08-11: 4 g via INTRAVENOUS
  Filled 2014-08-11: qty 500

## 2014-08-11 MED ORDER — LACTATED RINGERS IV SOLN: 500.0000 mL | INTRAVENOUS | Status: DC | PRN

## 2014-08-11 MED ORDER — BUDESONIDE-FORMOTEROL FUMARATE 160-4.5 MCG/ACT IN AERO
2.0000 | INHALATION_SPRAY | Freq: Every day | RESPIRATORY_TRACT | Status: DC
  Administered 2014-08-12: 2 via RESPIRATORY_TRACT
  Filled 2014-08-11: qty 6

## 2014-08-11 NOTE — H&P (Addendum)
[name] is a 38 y.o. female presenting for IOL for preeclampsia. Pregnancy complicated by AMA with normal Panorama, elevated AFP with normal anatomy screen, gestational diabetes on glyburide for a while-now off glyburide with good FBS reported, S/P gastric sleeve, hypothyroidism and asthma. Today in office BP is 150/90, urine 2+ protein, weight up 10# in 1 week and frontal HA. Maternal Medical History:  Fetal activity: Perceived fetal activity is decreased.      OB History    Gravida Para Term Preterm AB TAB SAB Ectopic Multiple Living   1         0     Past Medical History  Diagnosis Date  . Hypothyroidism     on medications  . Polycystic ovarian syndrome   . Asthma     has rescue inhaler  . Obesity   . Hyperlipidemia     2013 before bariatric surgery  . Plantar fasciitis, bilateral   . Hypertension     r sept 2013 high readings- no meds  . Anemia   . Gestational diabetes mellitus, antepartum   . Gestational diabetes    Past Surgical History  Procedure Laterality Date  . Plantar fascia release    . Hysteroscopy w/d&c  06/01/2012    Procedure: DILATATION AND CURETTAGE /HYSTEROSCOPY;  Surgeon: Zelphia CairoGretchen Adkins, MD;  Location: [facility];  Service: Gynecology;  Laterality: N/A;  with truclear  . Cervical polypectomy      05/2012  . Planter Left sept 2012  . Planter    . Plantar fascia surgery  05/2011    left foot  . Plantar fascia surgery  04/2011    rt foot  . Laparoscopic gastric sleeve resection N/A 11/19/2012    Procedure: LAPAROSCOPIC GASTRIC SLEEVE RESECTION;  Surgeon: Lodema PilotBrian Layton, DO;  Location: [facility];  Service: General;  Laterality: N/A;  laparoscopic sleeve gastrectomy with EGD  . Esophagogastroduodenoscopy N/A 11/19/2012    Procedure: ESOPHAGOGASTRODUODENOSCOPY (EGD);  Surgeon: Lodema PilotBrian Layton, DO;  Location: [facility];  Service: General;  Laterality: N/A;   Family History: family history includes Asthma in her paternal grandmother; Cancer in her paternal aunt; Heart  disease in her paternal grandmother. Social History:  reports that she quit smoking about 21 months ago. Her smoking use included Cigarettes. She has a .015 pack-year smoking history. She has never used smokeless tobacco. She reports that she does not drink alcohol or use illicit drugs.   Prenatal Transfer Tool  Maternal Diabetes: Yes:  Diabetes Type:  Diet controlled Genetic Screening: Normal Maternal Ultrasounds/Referrals: Normal Fetal Ultrasounds or other Referrals:  Referred to Materal Fetal Medicine  Maternal Substance Abuse:  No Significant Maternal Medications:  Meds include: Other: armour thyroid, glyburide for GDM until stopped recently Significant Maternal Lab Results:  Lab values include: Other: elevated AFP Other Comments:  None  Review of Systems  HENT:       Frontal HA today  Eyes: Negative for blurred vision.  Gastrointestinal: Negative for abdominal pain.  Neurological: Positive for headaches.      Blood pressure 153/91, pulse 66, temperature 98.2 F (36.8 C), temperature source Oral, resp. rate 16, height 5\' 3"  (1.6 m), weight 214 lb (97.07 kg), last menstrual period 11/08/2013. Maternal Exam:  Abdomen: Fetal presentation: vertex     Physical Exam  Cardiovascular: Normal rate and regular rhythm.   Respiratory: Effort normal and breath sounds normal.  GI: Soft. There is no tenderness.  Neurological: She has normal reflexes.   Cx 1/50/vtx   Prenatal labs: ABO,  Rh:   Antibody:   Rubella:   RPR:    HBsAg:    HIV:    GBS:     Assessment/Plan: 38 yo G1P0 with preeclampsia D/W patient two stage induction of labor and risks including failed induction, cesarean section, fetal distress.  Magnesium sulfate for seizure prophylaxis discussed Will check labs Patient states she understands and agrees  [facility],[name] E 08/11/2014, 12:42 PM

## 2014-08-11 NOTE — Plan of Care (Signed)
Problem: Phase I Progression Outcomes Goal: Medications/IV Fluids N/A Outcome: Completed/Met Date Met:  08/11/14 Goal: Induction meds as ordered Outcome: Completed/Met Date Met:  08/11/14

## 2014-08-11 NOTE — Progress Notes (Signed)
Previous noted symptoms have subsided per patient. Will continue to monitor.

## 2014-08-11 NOTE — Progress Notes (Signed)
FHT reactive BPs stable Magnesium running D/W patient and husband two stage induction All questions answered

## 2014-08-11 NOTE — Plan of Care (Signed)
Problem: Phase I Progression Outcomes Goal: OOB as tolerated unless otherwise ordered Outcome: Completed/Met Date Met:  08/11/14  Problem: Phase II Progression Outcomes Goal: Fetal monitoring per orders Outcome: Completed/Met Date Met:  08/11/14 Goal: Empty bladder prn Outcome: Completed/Met Date Met:  08/11/14 Goal: Activity as indicated Outcome: Completed/Met Date Met:  08/11/14

## 2014-08-11 NOTE — Plan of Care (Signed)
Problem: Phase I Progression Outcomes Goal: Assess per MD/Nurse,Routine-VS,FHR,UC,Head to Toe assess Outcome: Completed/Met Date Met:  08/11/14 Goal: Obtain and review prenatal records Outcome: Completed/Met Date Met:  08/11/14 Goal: Medical plan of care initiated within 2 hrs of admission Outcome: Completed/Met Date Met:  08/11/14

## 2014-08-11 NOTE — Progress Notes (Signed)
Patient complaining of burning in back of throat, burning down back of head, beginning to feel lethargic and weak. Magnesium turned off. RN notified MD of patient complaints and requested magnesium blood level to be drawn. Order given.

## 2014-08-12 ENCOUNTER — Inpatient Hospital Stay (HOSPITAL_COMMUNITY): Payer: 59 | Admitting: Anesthesiology

## 2014-08-12 ENCOUNTER — Encounter (HOSPITAL_COMMUNITY): Payer: Self-pay | Admitting: Anesthesiology

## 2014-08-12 LAB — CBC
HCT: 33.9 % — ABNORMAL LOW (ref 36.0–46.0)
HEMOGLOBIN: 11.3 g/dL — AB (ref 12.0–15.0)
MCH: 25.5 pg — ABNORMAL LOW (ref 26.0–34.0)
MCHC: 33.3 g/dL (ref 30.0–36.0)
MCV: 76.4 fL — AB (ref 78.0–100.0)
Platelets: 163 10*3/uL (ref 150–400)
RBC: 4.44 MIL/uL (ref 3.87–5.11)
RDW: 13 % (ref 11.5–15.5)
WBC: 11.2 10*3/uL — ABNORMAL HIGH (ref 4.0–10.5)

## 2014-08-12 LAB — GLUCOSE, CAPILLARY
GLUCOSE-CAPILLARY: 92 mg/dL (ref 70–99)
Glucose-Capillary: 89 mg/dL (ref 70–99)
Glucose-Capillary: 74 mg/dL (ref 70–99)

## 2014-08-12 LAB — RPR

## 2014-08-12 MED ORDER — FENTANYL 2.5 MCG/ML BUPIVACAINE 1/10 % EPIDURAL INFUSION (WH - ANES)
INTRAMUSCULAR | Status: DC | PRN
  Administered 2014-08-12: 14 mL/h via EPIDURAL

## 2014-08-12 MED ORDER — DIPHENHYDRAMINE HCL 50 MG/ML IJ SOLN: 12.5000 mg | INTRAMUSCULAR | Status: DC | PRN

## 2014-08-12 MED ORDER — FENTANYL 2.5 MCG/ML BUPIVACAINE 1/10 % EPIDURAL INFUSION (WH - ANES)
14.0000 mL/h | INTRAMUSCULAR | Status: DC | PRN
  Administered 2014-08-12 (×2): 14 mL/h via EPIDURAL
  Filled 2014-08-12 (×2): qty 125

## 2014-08-12 MED ORDER — EPHEDRINE 5 MG/ML INJ: 10.0000 mg | INTRAVENOUS | Status: DC | PRN

## 2014-08-12 MED ORDER — TERBUTALINE SULFATE 1 MG/ML IJ SOLN: 0.2500 mg | Freq: Once | INTRAMUSCULAR | Status: AC | PRN

## 2014-08-12 MED ORDER — LIDOCAINE HCL (PF) 1 % IJ SOLN
INTRAMUSCULAR | Status: DC | PRN
  Administered 2014-08-12: 8 mL
  Administered 2014-08-12: 9 mL

## 2014-08-12 MED ORDER — PHENYLEPHRINE 40 MCG/ML (10ML) SYRINGE FOR IV PUSH (FOR BLOOD PRESSURE SUPPORT): 80.0000 ug | PREFILLED_SYRINGE | INTRAVENOUS | Status: DC | PRN

## 2014-08-12 MED ORDER — PHENYLEPHRINE 40 MCG/ML (10ML) SYRINGE FOR IV PUSH (FOR BLOOD PRESSURE SUPPORT)
80.0000 ug | PREFILLED_SYRINGE | INTRAVENOUS | Status: DC | PRN
  Filled 2014-08-12 (×2): qty 10

## 2014-08-12 MED ORDER — OXYTOCIN 40 UNITS IN LACTATED RINGERS INFUSION - SIMPLE MED
1.0000 m[IU]/min | INTRAVENOUS | Status: DC
  Administered 2014-08-12: 2 m[IU]/min via INTRAVENOUS
  Filled 2014-08-12: qty 1000

## 2014-08-12 MED ORDER — LACTATED RINGERS IV SOLN
500.0000 mL | Freq: Once | INTRAVENOUS | Status: AC
  Administered 2014-08-12: 500 mL via INTRAVENOUS

## 2014-08-12 NOTE — Progress Notes (Signed)
Om MgSO4 2 grams/hr, now 1-2/post after 1 cytotec>>will start pit per protocol

## 2014-08-12 NOTE — Anesthesia Preprocedure Evaluation (Addendum)
Anesthesia Evaluation  Patient identified by MRN, date of birth, ID band Patient awake    Reviewed: Allergy & Precautions, H&P , NPO status , Patient's Chart, lab work & pertinent test results  Airway Mallampati: I  TM Distance: >3 FB Neck ROM: full    Dental no notable dental hx.    Pulmonary former smoker,    Pulmonary exam normal       Cardiovascular hypertension,     Neuro/Psych negative neurological ROS  negative psych ROS   GI/Hepatic Neg liver ROS,   Endo/Other  diabetes, GestationalMorbid obesity  Renal/GU negative Renal ROS     Musculoskeletal   Abdominal Normal abdominal exam  (+)   Peds  Hematology negative hematology ROS (+)   Anesthesia Other Findings   Reproductive/Obstetrics (+) Pregnancy                            Anesthesia Physical Anesthesia Plan  ASA: III  Anesthesia Plan: Epidural   Post-op Pain Management:    Induction:   Airway Management Planned:   Additional Equipment:   Intra-op Plan:   Post-operative Plan:   Informed Consent: I have reviewed the patients History and Physical, chart, labs and discussed the procedure including the risks, benefits and alternatives for the proposed anesthesia with the patient or authorized representative who has indicated his/her understanding and acceptance.     Plan Discussed with:   Anesthesia Plan Comments:        Anesthesia Quick Evaluation

## 2014-08-12 NOTE — Progress Notes (Signed)
On 8 mu/min pit, cx now tight 3/80%>>>ISE fpr AROM>.clear AF, discussed Stadol vs epid, stable FHR

## 2014-08-12 NOTE — Anesthesia Procedure Notes (Signed)
Epidural Patient location during procedure: OB Start time: 08/12/2014 2:38 PM End time: 08/12/2014 2:42 PM  Staffing Anesthesiologist: Leilani AbleHATCHETT, Akua Blethen  Preanesthetic Checklist Completed: patient identified, surgical consent, pre-op evaluation, timeout performed, IV checked, risks and benefits discussed and monitors and equipment checked  Epidural Patient position: sitting Prep: site prepped and draped and DuraPrep Patient monitoring: continuous pulse ox and blood pressure Approach: midline Location: L3-L4 Injection technique: LOR air  Needle:  Needle type: Tuohy  Needle gauge: 17 G Needle length: 9 cm and 9 Needle insertion depth: 5 cm cm Catheter type: closed end flexible Catheter size: 19 Gauge Catheter at skin depth: 10 cm Test dose: negative and Other  Assessment Sensory level: T9 Events: blood not aspirated, injection not painful, no injection resistance, negative IV test and no paresthesia  Additional Notes Reason for block:procedure for pain

## 2014-08-13 ENCOUNTER — Encounter (HOSPITAL_COMMUNITY)
Admission: AD | Disposition: A | Discharge status: Home / Self Care | Payer: Self-pay | Source: Ambulatory Visit | Attending: Obstetrics and Gynecology

## 2014-08-13 ENCOUNTER — Encounter (HOSPITAL_COMMUNITY): Payer: Self-pay

## 2014-08-13 LAB — COMPREHENSIVE METABOLIC PANEL
ALBUMIN: 2.3 g/dL — AB (ref 3.5–5.2)
ALT: 7 U/L (ref 0–35)
AST: 16 U/L (ref 0–37)
Alkaline Phosphatase: 195 U/L — ABNORMAL HIGH (ref 39–117)
Anion gap: 13 (ref 5–15)
BUN: 8 mg/dL (ref 6–23)
CO2: 21 mEq/L (ref 19–32)
Calcium: 7.1 mg/dL — ABNORMAL LOW (ref 8.4–10.5)
Chloride: 101 mEq/L (ref 96–112)
Creatinine, Ser: 0.99 mg/dL (ref 0.50–1.10)
GFR calc Af Amer: 85 mL/min — ABNORMAL LOW (ref >90)
GFR calc non Af Amer: 73 mL/min — ABNORMAL LOW (ref >90)
Glucose, Bld: 108 mg/dL — ABNORMAL HIGH (ref 70–99)
Potassium: 4.3 mEq/L (ref 3.7–5.3)
SODIUM: 135 meq/L — AB (ref 137–147)
TOTAL PROTEIN: 5 g/dL — AB (ref 6.0–8.3)
Total Bilirubin: 0.3 mg/dL (ref 0.3–1.2)

## 2014-08-13 LAB — GLUCOSE, CAPILLARY
Glucose-Capillary: 95 mg/dL (ref 70–99)
Glucose-Capillary: 98 mg/dL (ref 70–99)
Glucose-Capillary: 106 mg/dL — ABNORMAL HIGH (ref 70–99)

## 2014-08-13 SURGERY — Surgical Case
Anesthesia: Epidural | Site: Abdomen

## 2014-08-13 MED ORDER — NALOXONE HCL 1 MG/ML IJ SOLN
1.0000 ug/kg/h | INTRAVENOUS | Status: DC | PRN
  Filled 2014-08-13: qty 2

## 2014-08-13 MED ORDER — LACTATED RINGERS IV SOLN
INTRAVENOUS | Status: DC
  Administered 2014-08-13: 05:00:00 via INTRAVENOUS

## 2014-08-13 MED ORDER — SODIUM CHLORIDE 0.9 % IV SOLN: 250.0000 mL | INTRAVENOUS | Status: DC

## 2014-08-13 MED ORDER — LACTATED RINGERS IV SOLN
INTRAVENOUS | Status: DC | PRN
  Administered 2014-08-13 (×2): via INTRAVENOUS

## 2014-08-13 MED ORDER — OXYCODONE-ACETAMINOPHEN 5-325 MG PO TABS
1.0000 | ORAL_TABLET | ORAL | Status: DC | PRN
  Administered 2014-08-14 – 2014-08-15 (×7): 1 via ORAL
  Administered 2014-08-15: 2 via ORAL
  Administered 2014-08-15: 1 via ORAL
  Filled 2014-08-13 (×12): qty 1

## 2014-08-13 MED ORDER — IBUPROFEN 800 MG PO TABS
800.0000 mg | ORAL_TABLET | Freq: Three times a day (TID) | ORAL | Status: DC | PRN
  Administered 2014-08-13 – 2014-08-15 (×5): 800 mg via ORAL
  Filled 2014-08-13 (×5): qty 1

## 2014-08-13 MED ORDER — SIMETHICONE 80 MG PO CHEW
80.0000 mg | CHEWABLE_TABLET | ORAL | Status: DC
  Administered 2014-08-14 – 2014-08-15 (×2): 80 mg via ORAL
  Filled 2014-08-13 (×5): qty 1

## 2014-08-13 MED ORDER — ONDANSETRON HCL 4 MG PO TABS: 4.0000 mg | ORAL_TABLET | ORAL | Status: DC | PRN

## 2014-08-13 MED ORDER — PHENYLEPHRINE 40 MCG/ML (10ML) SYRINGE FOR IV PUSH (FOR BLOOD PRESSURE SUPPORT)
PREFILLED_SYRINGE | INTRAVENOUS | Status: AC
  Filled 2014-08-13: qty 5

## 2014-08-13 MED ORDER — PHENYLEPHRINE HCL 10 MG/ML IJ SOLN
INTRAMUSCULAR | Status: DC | PRN
  Administered 2014-08-13: 80 ug via INTRAVENOUS
  Administered 2014-08-13: 40 ug via INTRAVENOUS

## 2014-08-13 MED ORDER — LANOLIN HYDROUS EX OINT: 1.0000 "application " | TOPICAL_OINTMENT | CUTANEOUS | Status: DC | PRN

## 2014-08-13 MED ORDER — BISACODYL 10 MG RE SUPP
10.0000 mg | Freq: Every day | RECTAL | Status: DC | PRN
  Filled 2014-08-13: qty 1

## 2014-08-13 MED ORDER — FUROSEMIDE 10 MG/ML IJ SOLN
20.0000 mg | Freq: Once | INTRAMUSCULAR | Status: AC
  Administered 2014-08-13: 20 mg via INTRAVENOUS
  Filled 2014-08-13: qty 2

## 2014-08-13 MED ORDER — MEASLES, MUMPS & RUBELLA VAC ~~LOC~~ INJ
0.5000 mL | INJECTION | Freq: Once | SUBCUTANEOUS | Status: DC
  Filled 2014-08-13: qty 0.5

## 2014-08-13 MED ORDER — SCOPOLAMINE 1 MG/3DAYS TD PT72
1.0000 | MEDICATED_PATCH | Freq: Once | TRANSDERMAL | Status: DC
  Administered 2014-08-13: 1.5 mg via TRANSDERMAL

## 2014-08-13 MED ORDER — SIMETHICONE 80 MG PO CHEW
80.0000 mg | CHEWABLE_TABLET | Freq: Three times a day (TID) | ORAL | Status: DC
  Administered 2014-08-13 – 2014-08-16 (×9): 80 mg via ORAL
  Filled 2014-08-13 (×14): qty 1

## 2014-08-13 MED ORDER — NALOXONE HCL 0.4 MG/ML IJ SOLN
0.4000 mg | INTRAMUSCULAR | Status: DC | PRN
Start: 2014-08-13 — End: 2014-08-16

## 2014-08-13 MED ORDER — ONDANSETRON HCL 4 MG/2ML IJ SOLN
4.0000 mg | Freq: Three times a day (TID) | INTRAMUSCULAR | Status: DC | PRN
  Filled 2014-08-13: qty 2

## 2014-08-13 MED ORDER — MAGNESIUM SULFATE 40 G IN LACTATED RINGERS - SIMPLE
1.0000 g/h | INTRAVENOUS | Status: AC
  Administered 2014-08-13: 2 g/h via INTRAVENOUS
  Filled 2014-08-13 (×2): qty 500

## 2014-08-13 MED ORDER — DIPHENHYDRAMINE HCL 50 MG/ML IJ SOLN: 12.5000 mg | INTRAMUSCULAR | Status: DC | PRN

## 2014-08-13 MED ORDER — DIPHENHYDRAMINE HCL 25 MG PO CAPS
25.0000 mg | ORAL_CAPSULE | Freq: Four times a day (QID) | ORAL | Status: DC | PRN
  Filled 2014-08-13: qty 1

## 2014-08-13 MED ORDER — FLEET ENEMA 7-19 GM/118ML RE ENEM: 1.0000 | ENEMA | Freq: Every day | RECTAL | Status: DC | PRN

## 2014-08-13 MED ORDER — NALBUPHINE HCL 10 MG/ML IJ SOLN: 5.0000 mg | Freq: Once | INTRAMUSCULAR | Status: AC | PRN

## 2014-08-13 MED ORDER — SODIUM CHLORIDE 0.9 % IJ SOLN: 3.0000 mL | INTRAMUSCULAR | Status: DC | PRN

## 2014-08-13 MED ORDER — OXYTOCIN 10 UNIT/ML IJ SOLN
40.0000 [IU] | INTRAVENOUS | Status: DC | PRN
  Administered 2014-08-13: 40 [IU] via INTRAVENOUS

## 2014-08-13 MED ORDER — FENTANYL CITRATE 0.05 MG/ML IJ SOLN
25.0000 ug | INTRAMUSCULAR | Status: DC | PRN
  Administered 2014-08-13 (×2): 50 ug via INTRAVENOUS

## 2014-08-13 MED ORDER — NALBUPHINE HCL 10 MG/ML IJ SOLN
5.0000 mg | INTRAMUSCULAR | Status: DC | PRN
  Administered 2014-08-13: 5 mg via INTRAVENOUS
  Filled 2014-08-13: qty 1

## 2014-08-13 MED ORDER — SODIUM CHLORIDE 0.9 % IJ SOLN: 3.0000 mL | Freq: Two times a day (BID) | INTRAMUSCULAR | Status: DC

## 2014-08-13 MED ORDER — PRENATAL MULTIVITAMIN CH
1.0000 | ORAL_TABLET | Freq: Every day | ORAL | Status: DC
  Administered 2014-08-13 – 2014-08-15 (×3): 1 via ORAL
  Filled 2014-08-13 (×5): qty 1

## 2014-08-13 MED ORDER — OXYTOCIN 40 UNITS IN LACTATED RINGERS INFUSION - SIMPLE MED: 62.5000 mL/h | INTRAVENOUS | Status: AC

## 2014-08-13 MED ORDER — CEFOTETAN DISODIUM 2 G IJ SOLR
2.0000 g | Freq: Once | INTRAMUSCULAR | Status: DC
  Filled 2014-08-13: qty 2

## 2014-08-13 MED ORDER — FENTANYL CITRATE 0.05 MG/ML IJ SOLN
INTRAMUSCULAR | Status: AC
  Filled 2014-08-13: qty 2

## 2014-08-13 MED ORDER — MORPHINE SULFATE 0.5 MG/ML IJ SOLN
INTRAMUSCULAR | Status: AC
  Filled 2014-08-13: qty 10

## 2014-08-13 MED ORDER — ONDANSETRON HCL 4 MG/2ML IJ SOLN
4.0000 mg | INTRAMUSCULAR | Status: DC | PRN
Start: 2014-08-13 — End: 2014-08-16
  Administered 2014-08-13: 4 mg via INTRAVENOUS

## 2014-08-13 MED ORDER — ZOLPIDEM TARTRATE 5 MG PO TABS: 5.0000 mg | ORAL_TABLET | Freq: Every evening | ORAL | Status: DC | PRN

## 2014-08-13 MED ORDER — MENTHOL 3 MG MT LOZG
1.0000 | LOZENGE | OROMUCOSAL | Status: DC | PRN
  Filled 2014-08-13: qty 9

## 2014-08-13 MED ORDER — ONDANSETRON HCL 4 MG/2ML IJ SOLN
INTRAMUSCULAR | Status: DC | PRN
  Administered 2014-08-13: 4 mg via INTRAVENOUS

## 2014-08-13 MED ORDER — DIPHENHYDRAMINE HCL 25 MG PO CAPS
25.0000 mg | ORAL_CAPSULE | ORAL | Status: DC | PRN
  Filled 2014-08-13: qty 1

## 2014-08-13 MED ORDER — MORPHINE SULFATE (PF) 0.5 MG/ML IJ SOLN
INTRAMUSCULAR | Status: DC | PRN
  Administered 2014-08-13: 1 mg via INTRAVENOUS
  Administered 2014-08-13: 4 mg via EPIDURAL

## 2014-08-13 MED ORDER — SENNOSIDES-DOCUSATE SODIUM 8.6-50 MG PO TABS
2.0000 | ORAL_TABLET | ORAL | Status: DC
  Administered 2014-08-14 (×2): 2 via ORAL
  Filled 2014-08-13 (×4): qty 2

## 2014-08-13 MED ORDER — WITCH HAZEL-GLYCERIN EX PADS: 1.0000 "application " | MEDICATED_PAD | CUTANEOUS | Status: DC | PRN

## 2014-08-13 MED ORDER — ONDANSETRON HCL 4 MG/2ML IJ SOLN
INTRAMUSCULAR | Status: AC
  Filled 2014-08-13: qty 2

## 2014-08-13 MED ORDER — MEPERIDINE HCL 25 MG/ML IJ SOLN: 6.2500 mg | INTRAMUSCULAR | Status: DC | PRN

## 2014-08-13 MED ORDER — NALBUPHINE HCL 10 MG/ML IJ SOLN: 5.0000 mg | INTRAMUSCULAR | Status: DC | PRN

## 2014-08-13 MED ORDER — LIDOCAINE-EPINEPHRINE (PF) 2 %-1:200000 IJ SOLN
INTRAMUSCULAR | Status: AC
  Filled 2014-08-13: qty 20

## 2014-08-13 MED ORDER — DIBUCAINE 1 % RE OINT
1.0000 "application " | TOPICAL_OINTMENT | RECTAL | Status: DC | PRN
  Filled 2014-08-13: qty 28

## 2014-08-13 MED ORDER — SCOPOLAMINE 1 MG/3DAYS TD PT72
MEDICATED_PATCH | TRANSDERMAL | Status: AC
  Filled 2014-08-13: qty 1

## 2014-08-13 MED ORDER — THYROID 60 MG PO TABS
90.0000 mg | ORAL_TABLET | Freq: Every day | ORAL | Status: DC
  Administered 2014-08-13 – 2014-08-16 (×4): 90 mg via ORAL
  Filled 2014-08-13 (×4): qty 1

## 2014-08-13 MED ORDER — SODIUM BICARBONATE 8.4 % IV SOLN
INTRAVENOUS | Status: DC | PRN
  Administered 2014-08-13 (×4): 5 mL via EPIDURAL

## 2014-08-13 MED ORDER — FENTANYL CITRATE 0.05 MG/ML IJ SOLN
INTRAMUSCULAR | Status: DC | PRN
  Administered 2014-08-13 (×2): 50 ug via INTRAVENOUS

## 2014-08-13 MED ORDER — SIMETHICONE 80 MG PO CHEW
80.0000 mg | CHEWABLE_TABLET | ORAL | Status: DC | PRN
  Administered 2014-08-14: 80 mg via ORAL
  Filled 2014-08-13: qty 1

## 2014-08-13 MED ORDER — TETANUS-DIPHTH-ACELL PERTUSSIS 5-2.5-18.5 LF-MCG/0.5 IM SUSP
0.5000 mL | Freq: Once | INTRAMUSCULAR | Status: DC
  Filled 2014-08-13: qty 0.5

## 2014-08-13 MED ORDER — OXYTOCIN 10 UNIT/ML IJ SOLN
INTRAMUSCULAR | Status: AC
  Filled 2014-08-13: qty 4

## 2014-08-13 MED ORDER — OXYCODONE-ACETAMINOPHEN 5-325 MG PO TABS
2.0000 | ORAL_TABLET | ORAL | Status: DC | PRN
  Administered 2014-08-14 – 2014-08-16 (×4): 2 via ORAL
  Filled 2014-08-13 (×4): qty 2

## 2014-08-13 SURGICAL SUPPLY — 26 items
CLAMP CORD UMBIL (MISCELLANEOUS) IMPLANT
CLOSURE WOUND 1/2 X4 (GAUZE/BANDAGES/DRESSINGS)
CLOTH BEACON ORANGE TIMEOUT ST (SAFETY) ×3 IMPLANT
DRAPE SHEET LG 3/4 BI-LAMINATE (DRAPES) IMPLANT
DRSG OPSITE POSTOP 4X10 (GAUZE/BANDAGES/DRESSINGS) ×3 IMPLANT
DURAPREP 26ML APPLICATOR (WOUND CARE) ×3 IMPLANT
ELECT REM PT RETURN 9FT ADLT (ELECTROSURGICAL) ×3
ELECTRODE REM PT RTRN 9FT ADLT (ELECTROSURGICAL) ×1 IMPLANT
EXTRACTOR VACUUM M CUP 4 TUBE (SUCTIONS) IMPLANT
EXTRACTOR VACUUM M CUP 4' TUBE (SUCTIONS)
GLOVE BIO SURGEON STRL SZ7 (GLOVE) ×3 IMPLANT
GOWN STRL REUS W/TWL LRG LVL3 (GOWN DISPOSABLE) ×6 IMPLANT
KIT ABG SYR 3ML LUER SLIP (SYRINGE) ×3 IMPLANT
NEEDLE HYPO 25X5/8 SAFETYGLIDE (NEEDLE) ×3 IMPLANT
NS IRRIG 1000ML POUR BTL (IV SOLUTION) ×3 IMPLANT
PACK C SECTION WH (CUSTOM PROCEDURE TRAY) ×3 IMPLANT
PAD OB MATERNITY 4.3X12.25 (PERSONAL CARE ITEMS) ×3 IMPLANT
STRIP CLOSURE SKIN 1/2X4 (GAUZE/BANDAGES/DRESSINGS) IMPLANT
SUT CHROMIC 0 CTX 36 (SUTURE) ×12 IMPLANT
SUT MON AB 4-0 PS1 27 (SUTURE) ×3 IMPLANT
SUT PDS AB 0 CT1 27 (SUTURE) ×6 IMPLANT
SUT VIC AB 3-0 CT1 27 (SUTURE) ×4
SUT VIC AB 3-0 CT1 TAPERPNT 27 (SUTURE) ×2 IMPLANT
TOWEL OR 17X24 6PK STRL BLUE (TOWEL DISPOSABLE) ×3 IMPLANT
TRAY FOLEY CATH 14FR (SET/KITS/TRAYS/PACK) IMPLANT
WATER STERILE IRR 1000ML POUR (IV SOLUTION) IMPLANT

## 2014-08-13 NOTE — Lactation Note (Signed)
This note was copied from the chart of Leslie Guneet Toren. Lactation Consultation Note  Initial visit done with mom in AICU.  Baby has been nursing frequently and RN assisted this AM with latch and hand expression.  Colostrum present.  Breastfeeding consultation services and support information given and reviewed with patient.  Instructed to feed with any feeding cue and call with concerns/assist prn.  Patient Name: [name] [id number]'s Date: 08/13/2014 Reason for consult: Initial assessment   Maternal Data    Feeding Feeding Type: Breast Fed  LATCH Score/Interventions                      Lactation Tools Discussed/Used     Consult Status Consult Status: Follow-up Date: 08/13/14 Follow-up type: In-patient    Huston FoleyMOULDEN, Wasif Simonich S 08/13/2014, 10:23 AM

## 2014-08-13 NOTE — Anesthesia Postprocedure Evaluation (Signed)
  Anesthesia Post-op Note  Patient: [name]  Procedure(s) Performed: Procedure(s): CESAREAN SECTION (N/A)  Patient Location: A-ICU  Anesthesia Type:Epidural  Level of Consciousness: awake, alert , oriented and patient cooperative  Airway and Oxygen Therapy: Patient Spontanous Breathing  Post-op Pain: mild  Post-op Assessment: Patient's Cardiovascular Status Stable, Respiratory Function Stable, No headache, No backache, No residual numbness and No residual motor weakness  Post-op Vital Signs: stable  Last Vitals:  Filed Vitals:   08/13/14 0700  BP: 122/57  Pulse: 88  Temp: 37.1 C  Resp: 18    Complications: No apparent anesthesia complications

## 2014-08-13 NOTE — Progress Notes (Signed)
Subjective: Postpartum Day 0: Cesarean Delivery Patient reports tolerating PO.    Objective: Vital signs in last 24 hours: Temp:  [97.6 F (36.4 C)-99.3 F (37.4 C)] 98.8 F (37.1 C) (11/18 0700) Pulse Rate:  [60-104] 88 (11/18 0700) Resp:  [9-21] 18 (11/18 0700) BP: (112-173)/(57-105) 122/57 mmHg (11/18 0700) SpO2:  [93 %-100 %] 98 % (11/18 0700) Weight:  [212 lb 6.4 oz (96.344 kg)] 212 lb 6.4 oz (96.344 kg) (11/18 0400)  Physical Exam:  General: alert, cooperative, appears stated age and mild distress Lochia: appropriate Uterine Fundus: firm Incision: healing well DVT Evaluation: No evidence of DVT seen on physical exam. 3 + edema DTRs 3/4  Recent Labs  08/11/14 1255 08/12/14 1359  HGB 10.9* 11.3*  HCT 32.5* 33.9*   Labs stable Assessment/Plan: Status post Cesarean section. Postoperative course complicated by Preeclampsia  Continue current care  Magnesium for 24 hours.  Will give lasix IV to encourage diuresis.  [name] C 08/13/2014, 10:15 AM

## 2014-08-13 NOTE — Progress Notes (Signed)
incr caput w/o further descent>>rec CS for CPD, proced + risks discussed

## 2014-08-13 NOTE — Progress Notes (Signed)
Pushing just under 2 hrs, not comft with epidural, stable FHR, has made progress , now +1/will top up epidural and cont to push and eval

## 2014-08-13 NOTE — Op Note (Signed)
Preoperative diagnosis: Failure to descend  Postoperative diagnosis: Same  Procedure: Primary low transverse cesarean section  Surgeon: Marcelle OverlieHolland  Anesthesia: Epidural  EBL: 800 cc  , Cases: None  Drains: Foley catheter  Procedure and findings:  The patient was taken to the operating room after an adequate level of epidural anesthesia was obtained with the patient in left tilt position the abdomen prepped and draped in the usual fashion. Appropriate timeout for taken at that point. Transverse incision was made 3 finger breaths above the symphysis carried down to the fascia which was incised and extended transversely. Rectus muscles divided in the midline, peritoneum entered superiorly without incident and extended in a vertical fashion. The vesicouterine serosa was then incised and dissected below with sharp and blunt dissection, bladder blade repositioned. Transverse incision made in the lower segment clear fluid noted extended with bandage scissors patient was then delivered of a healthy infant, the infant was suctioned cord clamped and passed the pediatric team for further care. The placenta was then delivered manually intact, sent to pathology, cord pH was obtained  Uterus exteriorized, cavity wiped clean with a laparotomy pack closure obtained with the first layer of 0 chromic in a locked fashion followed by an imbricating layer of 0 chromic. This is hemostatic bladder flap area was intact and hemostatic there was a small superficial fibroma on the surface of the left ovary which was excised and oversewn with 3-0 Vicryl suture. The opposite ovary was normal prior to closure, sponge, needle, instrument counts reported as correct 2. Peritoneum was enclose with a 3-0 Vicryl suture. 3-0 Vicryl interrupted sutures were then used to reapproximate the rectus muscles in the midline. 0 PDS suture was then used to close the fascia from laterally to midline on either side. Subcutaneous tissue was then to  moderate and was hemostatic. This was not closed separately a 4-0 Monocryl subcuticular skin closure with honeycomb dressing she tolerated this well went to recovery room in good condition.  Dictated with [facility]  Enza Shone Milana ObeyM Stasha Naraine M.D.

## 2014-08-13 NOTE — Plan of Care (Signed)
Problem: Phase I Progression Outcomes Goal: Pain controlled with appropriate interventions Outcome: Completed/Met Date Met:  08/13/14 Goal: Foley catheter patent Outcome: Completed/Met Date Met:  08/13/14 Goal: IS, TCDB as ordered Outcome: Completed/Met Date Met:  08/13/14 Goal: VS, stable, temp < 100.4 degrees F Outcome: Completed/Met Date Met:  08/13/14

## 2014-08-13 NOTE — Progress Notes (Signed)
More comt now after redosing epid, stable FHR, has just resumed pushing effort, pushing better now, will recheck 

## 2014-08-13 NOTE — Transfer of Care (Signed)
Immediate Anesthesia Transfer of Care Note  Patient: [name]  Procedure(s) Performed: Procedure(s): CESAREAN SECTION (N/A)  Patient Location: PACU  Anesthesia Type:Epidural  Level of Consciousness: awake, alert  and oriented  Airway & Oxygen Therapy: Patient Spontanous Breathing  Post-op Assessment: Report given to PACU RN and Post -op Vital signs reviewed and stable  Post vital signs: Reviewed and stable  Complications: No apparent anesthesia complications

## 2014-08-14 ENCOUNTER — Encounter (HOSPITAL_COMMUNITY): Payer: Self-pay | Admitting: Obstetrics and Gynecology

## 2014-08-14 MED ORDER — BUDESONIDE-FORMOTEROL FUMARATE 160-4.5 MCG/ACT IN AERO: 2.0000 | INHALATION_SPRAY | Freq: Two times a day (BID) | RESPIRATORY_TRACT | Status: DC

## 2014-08-14 MED ORDER — BUDESONIDE-FORMOTEROL FUMARATE 160-4.5 MCG/ACT IN AERO
2.0000 | INHALATION_SPRAY | Freq: Two times a day (BID) | RESPIRATORY_TRACT | Status: DC
  Administered 2014-08-14 – 2014-08-15 (×3): 2 via RESPIRATORY_TRACT

## 2014-08-14 NOTE — Lactation Note (Addendum)
This note was copied from the chart of Boy Navayah Stickels. Lactation Consultation Note  Patient Name: Boy Earley Brookeslam Tax ZOXWR'UToday's Date: 08/14/2014 Reason for consult: Follow-up assessment Baby 39 hours of life. Patient's MBU RN, Marissa asked LC to assist patient with latching baby as baby doesn't seem to be transferring any colostrum. Assessed mom's breast and refit with #20 NS. Mom's breasts are beginning to fill, and mom can hand express drops of colostrum. Assessed baby's suck with gloved finger, baby thrusts tongue and not able to extend tongue very far across gumline. Tongue also does not lift up to palate well. Baby's frenulum appears tight, and tip of tongue is straight across. Discussed baby's tongue and pronounced recessed chin with mom. Enc mom to discuss with pediatrician. Assisted mom to latch baby in football position to right breast using #20 NS. Baby unable to obtain a deep latch, can hear clicking noise as baby attempting to suckle. Baby suckling with cheeks, they hollow with each suckle. When baby comes off of breast, there is very little colostrum in NS. Instead, bubbles of saliva in NS and baby is very fussy and appears hungry.   Attempted to hand express and offer baby EBM. Only able to obtain drops of EBM. Discussed offering formula for supplementation and then using DEBP to enc supply of EBM to increase. Mom requests to see how much EBM she can obtain first. Mom used DEBP for 15 minutes, only obtained drops of EBM. Mom requested similac to supplement. Mom given supplemental guidelines and demonstrated how to measure and offer formula in bottle. Baby had difficult time taking first bottle. Enc mom to pace feed, and to feed more often rather than feeding more than recommended amount at feeding. Plan is for mom to put baby to breast first with feeding cues, supplement after with EBM if available and make up difference following guidelines with formula. Enc mom to post-pump after each  feeding for 15 minutes and use EBM at next feeding. Discussed assessment, interventions and plan with patient's RN, Marissa. Enc mom to call for assistance with feeding as needed. Enc mom to have thyroid levels checked now that baby is delivered as this can affect milk supply.   Maternal Data Has patient been taught Hand Expression?: Yes Does the patient have breastfeeding experience prior to this delivery?: No  Feeding Feeding Type: Breast Fed Length of feed: 10 min  LATCH Score/Interventions Latch: Repeated attempts needed to sustain latch, nipple held in mouth throughout feeding, stimulation needed to elicit sucking reflex. Intervention(s): Adjust position;Assist with latch;Breast compression;Breast massage  Audible Swallowing: A few with stimulation  Type of Nipple: Everted at rest and after stimulation  Comfort (Breast/Nipple): Soft / non-tender     Hold (Positioning): Assistance needed to correctly position infant at breast and maintain latch. Intervention(s): Breastfeeding basics reviewed;Support Pillows;Position options  LATCH Score: 7  Lactation Tools Discussed/Used Tools: Nipple Shields Nipple shield size: 20 (Refitted from #24.) Breast pump type: Double-Electric Breast Pump   Consult Status Consult Status: PRN    Geralynn OchsWILLIARD, Lilee Aldea 08/14/2014, 5:26 PM

## 2014-08-14 NOTE — Progress Notes (Signed)
UR chart review completed.  

## 2014-08-14 NOTE — Plan of Care (Signed)
Problem: Phase I Progression Outcomes Goal: Initial discharge plan identified Outcome: Completed/Met Date Met:  08/14/14 Goal: Other Phase I Outcomes/Goals Outcome: Not Applicable Date Met:  13/68/59  Problem: Phase II Progression Outcomes Goal: Tolerating diet Outcome: Completed/Met Date Met:  08/14/14 Goal: Other Phase II Outcomes/Goals Outcome: Not Applicable Date Met:  92/34/14

## 2014-08-14 NOTE — Progress Notes (Signed)
[name] is a 38 year old POD #1 from C Section. Magnesium discontinued at 6 am. Some urinary retention - had In and Out cath x 1  No diagnosis found. Past Medical History  Diagnosis Date  . Hypothyroidism     on medications  . Polycystic ovarian syndrome   . Asthma     has rescue inhaler  . Obesity   . Hyperlipidemia     2013 before bariatric surgery  . Plantar fasciitis, bilateral   . Hypertension     r sept 2013 high readings- no meds  . Anemia   . Gestational diabetes mellitus, antepartum   . Gestational diabetes    Current Facility-Administered Medications  Medication Dose Route Frequency Provider Last Rate Last Dose  . bisacodyl (DULCOLAX) suppository 10 mg  10 mg Rectal Daily PRN Margarette Asal, MD      . budesonide-formoterol [facility]) 160-4.5 MCG/ACT inhaler 2 puff  2 puff Inhalation BID Luz Lex, MD   2 puff at 08/14/14 0148  . witch hazel-glycerin (TUCKS) pad 1 application  1 application Topical PRN Margarette Asal, MD       And  . dibucaine (NUPERCAINAL) 1 % rectal ointment 1 application  1 application Rectal PRN Margarette Asal, MD      . diphenhydrAMINE (BENADRYL) injection 12.5 mg  12.5 mg Intravenous Q4H PRN Lyndle Herrlich, MD       Or  . diphenhydrAMINE (BENADRYL) capsule 25 mg  25 mg Oral Q4H PRN Lyndle Herrlich, MD      . diphenhydrAMINE (BENADRYL) capsule 25 mg  25 mg Oral Q6H PRN Margarette Asal, MD      . ibuprofen (ADVIL,MOTRIN) tablet 800 mg  800 mg Oral Q8H PRN Margarette Asal, MD   800 mg at 08/13/14 1732  . lactated ringers infusion   Intravenous Continuous Margarette Asal, MD   Stopped at 08/14/14 0201  . lanolin ointment 1 application  1 application Topical PRN Margarette Asal, MD      . measles, mumps and rubella vaccine (MMR) injection 0.5 mL  0.5 mL Subcutaneous Once Margarette Asal, MD      . menthol-cetylpyridinium (CEPACOL) lozenge 3 mg  1 lozenge Oral Q2H PRN Margarette Asal, MD      . nalbuphine (NUBAIN) injection 5  mg  5 mg Intravenous Q4H PRN Lyndle Herrlich, MD   5 mg at 08/13/14 1533   Or  . nalbuphine (NUBAIN) injection 5 mg  5 mg Subcutaneous Q4H PRN Lyndle Herrlich, MD      . naloxone [facility]) 2 mg in dextrose 5 % 250 mL infusion  1-4 mcg/kg/hr Intravenous Continuous PRN Lyndle Herrlich, MD      . naloxone [facility]) injection 0.4 mg  0.4 mg Intravenous PRN Lyndle Herrlich, MD       And  . sodium chloride 0.9 % injection 3 mL  3 mL Intravenous PRN Lyndle Herrlich, MD      . ondansetron [facility]) injection 4 mg  4 mg Intravenous Q8H PRN Lyndle Herrlich, MD      . ondansetron [facility]) tablet 4 mg  4 mg Oral Q4H PRN Margarette Asal, MD       Or  . ondansetron [facility]) injection 4 mg  4 mg Intravenous Q4H PRN Margarette Asal, MD   4 mg at 08/13/14 1038  . oxyCODONE-acetaminophen (PERCOCET/ROXICET) 5-325 MG per tablet 1 tablet  1 tablet Oral Q4H PRN Margarette Asal, MD  1 tablet at 08/14/14 0033  . oxyCODONE-acetaminophen (PERCOCET/ROXICET) 5-325 MG per tablet 2 tablet  2 tablet Oral Q4H PRN Margarette Asal, MD   2 tablet at 08/14/14 0800  . prenatal multivitamin tablet 1 tablet  1 tablet Oral Q1200 Margarette Asal, MD   1 tablet at 08/13/14 1241  . senna-docusate (Senokot-S) tablet 2 tablet  2 tablet Oral Q24H Margarette Asal, MD   2 tablet at 08/14/14 0123  . simethicone (MYLICON) chewable tablet 80 mg  80 mg Oral TID PC Margarette Asal, MD   80 mg at 08/13/14 1732  . simethicone (MYLICON) chewable tablet 80 mg  80 mg Oral Q24H Margarette Asal, MD   80 mg at 08/14/14 0123  . simethicone (MYLICON) chewable tablet 80 mg  80 mg Oral PRN Margarette Asal, MD      . sodium phosphate (FLEET) 7-19 GM/118ML enema 1 enema  1 enema Rectal Daily PRN Margarette Asal, MD      . Tdap Durwin Reges) injection 0.5 mL  0.5 mL Intramuscular Once Margarette Asal, MD      . thyroid (ARMOUR) tablet 90 mg  90 mg Oral QAC breakfast Damien Fusi, NP   90 mg at 08/13/14 1733  . zolpidem (AMBIEN) tablet 5 mg  5 mg Oral QHS PRN  Margarette Asal, MD       No Known Allergies Active Problems:   Preeclampsia  Blood pressure 124/76, pulse 84, temperature 98.5 F (36.9 C), temperature source Oral, resp. rate 16, height 5' 3"  (1.6 m), weight 96.344 kg (212 lb 6.4 oz), last menstrual period 11/08/2013, SpO2 99 %, unknown if currently breastfeeding.  Subjective Objective  Bandage clean and dry Assessment & Plan POD #1 C Section Preeclampsia Transfer to floor Routine orders Jodell Weitman L 08/14/2014

## 2014-08-14 NOTE — Plan of Care (Signed)
Problem: Phase I Progression Outcomes Goal: Voiding adequately Outcome: Completed/Met Date Met:  08/14/14 Goal: OOB as tolerated unless otherwise ordered Outcome: Completed/Met Date Met:  08/14/14  Problem: Phase II Progression Outcomes Goal: Pain controlled on oral analgesia Outcome: Completed/Met Date Met:  08/14/14 Goal: Progress activity as tolerated unless otherwise ordered Outcome: Completed/Met Date Met:  08/14/14 Goal: Afebrile, VS remain stable Outcome: Completed/Met Date Met:  08/14/14 Goal: Incision intact & without signs/symptoms of infection Outcome: Completed/Met Date Met:  08/14/14 Goal: Rh isoimmunization per orders Outcome: Not Applicable Date Met:  46/21/94

## 2014-08-15 NOTE — Lactation Note (Signed)
This note was copied from the chart of Leslie Alka Rathman. Lactation Consultation Note Follow up visit at 67 hours of age.  Baby has had 6 breast feedings and 3 bottle feedings with 2 voids and 3 stools in the past 24 hours.  Chart indicated a break in feedings between 0429 and 1215, but mom reports early am cluster feeding.  Mom is occasionally supplementing 10-7320mls, feeding guidelines given encouragement to continue supplementation.  Mom offered 5ml of colostrum from early pumping today.  Mom encouraged to pump every 3 hours to establish milk supply.  Baby just finished a bottle prior to this visit. Mom began pumping with several mls quickly expressed.  Mom reports being concerned about "tongue tie."  Oral assessment not done at this visit, but encouraged to follow up with pediatrician.  Mom reports improved latch with nipple shield and is seeing colostrum with NS use.     Patient Name: [name] ZOXWR'UToday's Date: 08/15/2014 Reason for consult: Follow-up assessment;Difficult latch   Maternal Data Has patient been taught Hand Expression?: Yes Does the patient have breastfeeding experience prior to this delivery?: No  Feeding Feeding Type: Bottle Fed - Formula Length of feed: 20 min  LATCH Score/Interventions                Intervention(s): Breastfeeding basics reviewed     Lactation Tools Discussed/Used     Consult Status Consult Status: Follow-up Date: 08/16/14 Follow-up type: In-patient    Jannifer RodneyShoptaw, Jana Lynn 08/15/2014, 10:15 PM

## 2014-08-15 NOTE — Plan of Care (Signed)
Problem: Discharge Progression Outcomes Goal: Barriers To Progression Addressed/Resolved Outcome: Completed/Met Date Met:  08/15/14 Goal: Tolerating diet Outcome: Completed/Met Date Met:  07/28/10 Goal: Complications resolved/controlled Outcome: Completed/Met Date Met:  08/15/14 Goal: Pain controlled with appropriate interventions Outcome: Completed/Met Date Met:  08/15/14 Goal: Afebrile, VS remain stable at discharge Outcome: Completed/Met Date Met:  08/15/14 Goal: MMR given as ordered Outcome: Not Applicable Date Met:  17/35/67

## 2014-08-15 NOTE — Progress Notes (Signed)
Subjective: Postpartum Day 2: Cesarean Delivery Patient reports incisional pain, tolerating PO, + flatus and no problems voiding.  Complains of edema to thighs  Objective: Vital signs in last 24 hours: Temp:  [98.5 F (36.9 C)-98.9 F (37.2 C)] 98.5 F (36.9 C) (11/20 0432) Pulse Rate:  [66-82] 66 (11/20 0432) Resp:  [16-18] 18 (11/20 0432) BP: (121-147)/(63-89) 141/78 mmHg (11/20 0432) SpO2:  [98 %-99 %] 99 % (11/19 1610)  Physical Exam:  General: alert and cooperative Lochia: appropriate Uterine Fundus: firm Incision: healing well DVT Evaluation: No evidence of DVT seen on physical exam. Negative Homan's sign. No cords or calf tenderness. Calf/Ankle edema is present.   Recent Labs  08/12/14 1359  HGB 11.3*  HCT 33.9*    Assessment/Plan: Status post Cesarean section. Doing well postoperatively.  Continue current care.  [name] G 08/15/2014, 8:17 AM

## 2014-08-16 MED ORDER — OXYCODONE-ACETAMINOPHEN 5-325 MG PO TABS: 1.0000 | ORAL_TABLET | ORAL | Status: DC | PRN

## 2014-08-16 MED ORDER — IBUPROFEN 800 MG PO TABS: 800.0000 mg | ORAL_TABLET | Freq: Three times a day (TID) | ORAL | Status: DC | PRN

## 2014-08-16 NOTE — Discharge Summary (Signed)
Obstetric Discharge Summary Reason for Admission: induction of labor Prenatal Procedures: Preeclampsia and ultrasound Intrapartum Procedures: cesarean: low cervical, transverse Postpartum Procedures: none Complications-Operative and Postpartum: none HEMOGLOBIN  Date Value Ref Range Status  08/12/2014 11.3* 12.0 - 15.0 g/dL Final   HCT  Date Value Ref Range Status  08/12/2014 33.9* 36.0 - 46.0 % Final    Physical Exam:  General: alert and cooperative Lochia: appropriate Uterine Fundus: firm Incision: healing well, no significant drainage DVT Evaluation: No evidence of DVT seen on physical exam.  Discharge Diagnoses: Term Pregnancy-delivered, pre-eclampsia  Discharge Information: Date: 08/16/2014 Activity: pelvic rest Diet: routine Medications: PNV, Ibuprofen, Percocet and home meds Condition: stable Instructions: refer to practice specific booklet Discharge to: home Follow-up Information    Schedule an appointment as soon as possible for a visit in 2 weeks to follow up.      Newborn Data: Live born female  Birth Weight: 7 lb 11.5 oz (3501 g) APGAR: 8, 9  Home with mother.  [name] 08/16/2014, 8:27 AM

## 2014-08-16 NOTE — Plan of Care (Signed)
Problem: Discharge Progression Outcomes Goal: Activity appropriate for discharge plan Outcome: Completed/Met Date Met:  08/16/14 Goal: Remove staples per MD order Outcome: Not Applicable Date Met:  83/47/58 Goal: Discharge plan in place and appropriate Outcome: Completed/Met Date Met:  08/16/14 Goal: Other Discharge Outcomes/Goals Outcome: Not Applicable Date Met:  30/74/60

## 2014-08-20 ENCOUNTER — Ambulatory Visit (HOSPITAL_COMMUNITY): Admit: 2014-08-20 | Payer: 59

## 2014-08-29 ENCOUNTER — Ambulatory Visit (HOSPITAL_COMMUNITY)
Admission: RE | Admit: 2014-08-29 | Discharge: 2014-08-29 | Disposition: A | Discharge status: Home / Self Care | Payer: 59 | Source: Ambulatory Visit | Attending: Obstetrics and Gynecology | Admitting: Obstetrics and Gynecology

## 2014-08-29 NOTE — Lactation Note (Signed)
Lactation Consult  Mother's reason for visit:  Latching is hard and baby does not seem to get full. Visit Type:  OP Appointment Notes:  Mom has been working hard with Bilal to get him to latch.  He is currently latching but was Supplemented with formula until 3 days ago.  [name] is now exclusively BF and he is well over BW but it is hard to determine if his weight has been affected by exclusive BF.  Today he latched to the breast and suckled.  Dimpling was noted as were sucking blisters on his upper lip. His latch is somewhat shallow.  Sucking blisters indicate that he is using his lips to maintain a vacuum rather than intraoral pressure.  Dimpling is also indicative of improper tongue use.  Mom reports that he bites at the breast but that it is getting better.  He transferred a total of 78 ml.  Mom has decreased pumping to twice a day.  I encouraged her to post pump 3-4 times a day until we are confident that [name] is contributing to transferring and that it is not all dependent on mom's MER.  Oral evaluation: Upper labial frenum that is wide and extends to just above the alveolar ridge.  This makes it difficult for him to flange his upper lip.  Tongue is bowl shaped when crying which is indicative of difficulty with tonge elevation.  He also has a sensitive gag reflex and I talked to mom about working with him to desensitze it. He is also chewing at the breast.  We worked on depth and I talked to mom about my findings.  I assured her that BF was going well at this point. I explained to her that we needed to monitor his weight and her abundant MS.  Support group was encouraged as were sources she could explore for more information on tongue restriction.  She reported to me that [name] has an uncle who had speech difficulty so she plans to do some research on tongue restriction. Consult:  Initial Lactation Consultant:  Soyla DryerJoseph,  Jessen Siegman  ________________________________________________________________________ Leslie FloresBaby's Name: Leslie ChaletBilal Bassiouny Date of Birth: 08/13/2012 Pediatrician: Eartha InchVapne Gender: female Gestational Age: [id number] (At Birth) Birth Weight: 7 lb 11.5 oz (3501 g) Weight at Discharge: Weight: 7 lb 1.9 oz (3230 g)Date of Discharge: 08/16/2014 Filed Weights   08/14/14 0340 08/14/14 2329 08/15/14 2300  Weight: 7 lb 5.3 oz (3325 g) 7 lb 1.9 oz (3230 g) 7 lb 1.9 oz (3230 g)     ________________________________________________________________________  Mother's Name: Annitta NeedsEslam G Chuong Type of delivery:  c-section Breastfeeding Experience:  First baby Maternal Medical Conditions:  Thyroid, Polycystic ovarian syndrome, Gestational diabetes mellitis and Pregnancy induced hypertension,pre-eclampsia Maternal Medications:  Armour Thyroid, PNV  ________________________________________________________________________  Breastfeeding History (Post Discharge)  Frequency of breastfeeding:  10-12 times in 24 hours Duration of feeding:  15-20 Minutes     Infant Intake and Output Assessment  Voids:  7-10 in 24 hrs.  Color:  Clear yellow Stools:  7-10 in 24 hrs.  Color:  Yellow  ________________________________________________________________________  ______________________________________________________________________ Feeding Assessment/Evaluation  Pre-feed weight:  4084 g   Post-feed weight:  4146 g after feeding on the left 4162g Amount transferred:  78 ml    Total amount pumped post feed:  R 2.5 oz  L 0.5 oz (baby fed longer on the left)   T

## 2014-09-16 ENCOUNTER — Other Ambulatory Visit: Payer: Self-pay | Admitting: Obstetrics and Gynecology

## 2014-09-17 LAB — CYTOLOGY - PAP

## 2014-10-04 ENCOUNTER — Emergency Department (HOSPITAL_COMMUNITY)
Admission: EM | Admit: 2014-10-04 | Discharge: 2014-10-05 | Disposition: A | Discharge status: Home / Self Care | Payer: 59 | Attending: Emergency Medicine | Admitting: Emergency Medicine

## 2014-10-04 DIAGNOSIS — X58XXXA Exposure to other specified factors, initial encounter: Secondary | ICD-10-CM | POA: Diagnosis not present

## 2014-10-04 DIAGNOSIS — T498X5A Adverse effect of other topical agents, initial encounter: Secondary | ICD-10-CM | POA: Diagnosis not present

## 2014-10-04 DIAGNOSIS — R0902 Hypoxemia: Secondary | ICD-10-CM | POA: Insufficient documentation

## 2014-10-04 DIAGNOSIS — Z87891 Personal history of nicotine dependence: Secondary | ICD-10-CM | POA: Insufficient documentation

## 2014-10-04 DIAGNOSIS — Z79899 Other long term (current) drug therapy: Secondary | ICD-10-CM | POA: Diagnosis not present

## 2014-10-04 DIAGNOSIS — Z8739 Personal history of other diseases of the musculoskeletal system and connective tissue: Secondary | ICD-10-CM | POA: Diagnosis not present

## 2014-10-04 DIAGNOSIS — T7840XA Allergy, unspecified, initial encounter: Secondary | ICD-10-CM

## 2014-10-04 DIAGNOSIS — J45901 Unspecified asthma with (acute) exacerbation: Secondary | ICD-10-CM | POA: Insufficient documentation

## 2014-10-04 DIAGNOSIS — E039 Hypothyroidism, unspecified: Secondary | ICD-10-CM | POA: Insufficient documentation

## 2014-10-04 DIAGNOSIS — Y998 Other external cause status: Secondary | ICD-10-CM | POA: Insufficient documentation

## 2014-10-04 DIAGNOSIS — Z8632 Personal history of gestational diabetes: Secondary | ICD-10-CM | POA: Insufficient documentation

## 2014-10-04 DIAGNOSIS — Y9289 Other specified places as the place of occurrence of the external cause: Secondary | ICD-10-CM | POA: Insufficient documentation

## 2014-10-04 DIAGNOSIS — R21 Rash and other nonspecific skin eruption: Secondary | ICD-10-CM | POA: Diagnosis present

## 2014-10-04 DIAGNOSIS — E669 Obesity, unspecified: Secondary | ICD-10-CM | POA: Insufficient documentation

## 2014-10-04 DIAGNOSIS — Y9389 Activity, other specified: Secondary | ICD-10-CM | POA: Diagnosis not present

## 2014-10-04 DIAGNOSIS — L251 Unspecified contact dermatitis due to drugs in contact with skin: Secondary | ICD-10-CM | POA: Insufficient documentation

## 2014-10-04 DIAGNOSIS — I10 Essential (primary) hypertension: Secondary | ICD-10-CM | POA: Insufficient documentation

## 2014-10-04 NOTE — ED Notes (Signed)
Bed: WA24 Expected date:  Expected time:  Means of arrival:  Comments: EMS 

## 2014-10-04 NOTE — ED Notes (Signed)
Per EMS pt coming from home with c/o of allergic reaction to KY jelly, pt sts shortly after she used it she felt her chest was getting tight and she couldn't breathe.EMS sts on their arrival pt was tachy, with O2 sats in low 80's. They gave her 50 mg benadryl po, 8mg  zofran IV, 50 mg zantac, 10 mg albuterol, 0.5 Atrovent, 125mg  solumedrol.

## 2014-10-05 ENCOUNTER — Encounter (HOSPITAL_COMMUNITY): Payer: Self-pay | Admitting: *Deleted

## 2014-10-05 MED ORDER — PREDNISONE 20 MG PO TABS: ORAL_TABLET | ORAL | Status: DC

## 2014-10-05 MED ORDER — CETIRIZINE HCL 10 MG PO CAPS: 10.0000 mg | ORAL_CAPSULE | Freq: Every day | ORAL | Status: DC | PRN

## 2014-10-05 MED ORDER — EPINEPHRINE 0.3 MG/0.3ML IJ SOAJ: 0.3000 mg | Freq: Once | INTRAMUSCULAR | Status: AC | PRN

## 2014-10-05 MED ORDER — FAMOTIDINE 20 MG PO TABS: 20.0000 mg | ORAL_TABLET | Freq: Two times a day (BID) | ORAL | Status: DC

## 2014-10-05 NOTE — ED Provider Notes (Signed)
CSN: [id number]     Arrival date & time 10/04/14  2345 History   First MD Initiated Contact with Patient 10/05/14 0002     Chief Complaint  Patient presents with  . Allergic Reaction     (Consider location/radiation/quality/duration/timing/severity/associated sxs/prior Treatment) HPI  Patient reports tonight she and her husband used [employer] specialty products that she'd used once a long time ago. She reports afterwards she started feeling short of breath. She states she has asthma and she thought her asthma was flaring up. She states she used her inhaler 4 puffs but her chest felt so tight she felt like she couldn't breathe. She denies any feeling of swelling or restriction in her throat. She then developed a rash around her ankles and started itching all over. Her husband reports her face was red because she was coughing and gasping for air. EMS was called and patient was noted to have a pulse ox in the low 80s and her heart rate was fast. They gave her 50 mg of Benadryl by mouth, 8 mg of Zofran IV, 50 mg of Zantac IV, 10 mg of albuterol with 0.5 mg Atrovent nebulizer and Solu-Medrol 125 mg. Patient states she feels fine now. She states her rash is much better and her itching is gone. She states her breathing is unrestricted. She states she's never had anything like this happen before.  PCP Dr Nicholos Johns  Past Medical History  Diagnosis Date  . Hypothyroidism     on medications  . Polycystic ovarian syndrome   . Asthma     has rescue inhaler  . Obesity   . Hyperlipidemia     2013 before bariatric surgery  . Plantar fasciitis, bilateral   . Hypertension     r sept 2013 high readings- no meds  . Anemia   . Gestational diabetes mellitus, antepartum   . Gestational diabetes    Past Surgical History  Procedure Laterality Date  . Plantar fascia release    . Hysteroscopy w/d&c  06/01/2012    Procedure: DILATATION AND CURETTAGE /HYSTEROSCOPY;  Surgeon: Zelphia Cairo, MD;  Location: [facility];   Service: Gynecology;  Laterality: N/A;  with truclear  . Cervical polypectomy      05/2012  . Planter Left sept 2012  . Planter    . Plantar fascia surgery  05/2011    left foot  . Plantar fascia surgery  04/2011    rt foot  . Laparoscopic gastric sleeve resection N/A 11/19/2012    Procedure: LAPAROSCOPIC GASTRIC SLEEVE RESECTION;  Surgeon: Lodema Pilot, DO;  Location: [facility];  Service: General;  Laterality: N/A;  laparoscopic sleeve gastrectomy with EGD  . Esophagogastroduodenoscopy N/A 11/19/2012    Procedure: ESOPHAGOGASTRODUODENOSCOPY (EGD);  Surgeon: Lodema Pilot, DO;  Location: [facility];  Service: General;  Laterality: N/A;  . Cesarean section N/A 08/13/2014    Procedure: CESAREAN SECTION;  Surgeon: Meriel Pica, MD;  Location: [facility];  Service: Obstetrics;  Laterality: N/A;   Family History  Problem Relation Age of Onset  . Cancer Paternal Aunt     breast and colon  . Asthma Paternal Grandmother   . Heart disease Paternal Grandmother    History  Substance Use Topics  . Smoking status: Former Smoker -- 0.03 packs/day for .5 years    Types: Cigarettes    Quit date: 10/29/2012  . Smokeless tobacco: Never Used     Comment: smoked socially only   . Alcohol Use: No   Lives at home Lives with  spouse with 482 month old son   OB History    Gravida Para Term Preterm AB TAB SAB Ectopic Multiple Living   1 1 1       0 1     Review of Systems  All other systems reviewed and are negative.     Allergies  Review of patient's allergies indicates no known allergies.  Home Medications   Prior to Admission medications   Medication Sig Start Date End Date Taking? Authorizing Provider  budesonide-formoterol (SYMBICORT) 160-4.5 MCG/ACT inhaler Inhale 2 puffs into the lungs every morning. Patient taking differently: Inhale 2 puffs into the lungs at bedtime.  03/02/14  Yes Waymon Budgelinton D Young, MD  Fenugreek 500 MG CAPS Take 1,000 mg by mouth 2 (two) times daily.   Yes Historical Provider,  MD  Prenatal Vit-Fe Fumarate-FA (PRENATAL MULTIVITAMIN) TABS tablet Take 1 tablet by mouth at bedtime.   Yes Historical Provider, MD  thyroid (ARMOUR) 90 MG tablet Take 90 mg by mouth daily before breakfast.    Yes Historical Provider, MD  albuterol (PROAIR HFA) 108 (90 BASE) MCG/ACT inhaler 2 puffs every 4 hours as needed only  if your can't catch your breath 08/07/14   Nyoka CowdenMichael B Wert, MD  ibuprofen (ADVIL,MOTRIN) 800 MG tablet Take 1 tablet (800 mg total) by mouth every 8 (eight) hours as needed for moderate pain. Patient not taking: Reported on 10/05/2014 08/16/14   Zelphia CairoGretchen Adkins, MD  oxyCODONE-acetaminophen (PERCOCET/ROXICET) 5-325 MG per tablet Take 1 tablet by mouth every 4 (four) hours as needed (for pain scale less than 7). Patient not taking: Reported on 10/05/2014 08/16/14   Zelphia CairoGretchen Adkins, MD   BP 121/65 mmHg  Pulse 117  Temp(Src) 98.4 F (36.9 C) (Oral)  Resp 20  SpO2 96%  Vital signs normal except tachycardia  Physical Exam  Constitutional: She is oriented to person, place, and time. She appears well-developed and well-nourished.  Non-toxic appearance. She does not appear ill. No distress.  HENT:  Head: Normocephalic and atraumatic.  Right Ear: External ear normal.  Left Ear: External ear normal.  Nose: Nose normal. No mucosal edema or rhinorrhea.  Mouth/Throat: Oropharynx is clear and moist and mucous membranes are normal. No dental abscesses or uvula swelling.  Eyes: Conjunctivae and EOM are normal. Pupils are equal, round, and reactive to light.  Neck: Normal range of motion and full passive range of motion without pain. Neck supple.  Cardiovascular: Normal rate, regular rhythm and normal heart sounds.  Exam reveals no gallop and no friction rub.   No murmur heard. Pulmonary/Chest: Effort normal and breath sounds normal. No respiratory distress. She has no wheezes. She has no rhonchi. She has no rales. She exhibits no tenderness and no crepitus.  Abdominal: Soft.  Normal appearance and bowel sounds are normal. She exhibits no distension. There is no tenderness. There is no rebound and no guarding.  Musculoskeletal: Normal range of motion. She exhibits no edema or tenderness.  Moves all extremities well.   Neurological: She is alert and oriented to person, place, and time. She has normal strength. No cranial nerve deficit.  Skin: Skin is warm, dry and intact. Rash noted. No erythema. No pallor.  Small area of redness above her right lateral malleolus  Psychiatric: She has a normal mood and affect. Her speech is normal and behavior is normal. Her mood appears not anxious.  Nursing note and vitals reviewed.   ED Course  Procedures (including critical care time) Patient was observed in the emergency department  to make sure she did not decompensate.  01:45 pt is feeling fine. Pulse ox 96%, HR in low 90's. Redness around ankles is gone. Ready to be discharged.   Labs Review Labs Reviewed - No data to display  Imaging Review No results found.   EKG Interpretation None      MDM   Final diagnoses:  Allergic reaction, initial encounter  Hypoxia    New Prescriptions   CETIRIZINE HCL (ZYRTEC ALLERGY) 10 MG CAPS    Take 1 capsule (10 mg total) by mouth daily as needed.   EPINEPHRINE 0.3 MG/0.3 ML IJ SOAJ INJECTION    Inject 0.3 mLs (0.3 mg total) into the muscle once as needed (severe allergic reaction).   FAMOTIDINE (PEPCID) 20 MG TABLET    Take 1 tablet (20 mg total) by mouth 2 (two) times daily.   PREDNISONE (DELTASONE) 20 MG TABLET    Take 3 po QD x 3d , then 2 po QD x 3d then 1 po QD x 3d    Plan discharge  Devoria Albe, MD, Franz Dell, MD 10/05/14 [id number]

## 2014-10-05 NOTE — Discharge Instructions (Signed)
Take the medications as prescribed. If you start feeling worse again with the struggling to breathe, use the epipen then call 911.  Pump your breast milk and call your OB office to see when you can start breast feeding again.    Allergies Allergies may happen from anything your body is sensitive to. This may be food, medicines, pollens, chemicals, and nearly anything around you in everyday life that produces allergens. An allergen is anything that causes an allergy producing substance. Heredity is often a factor in causing these problems. This means you may have some of the same allergies as your parents. Food allergies happen in all age groups. Food allergies are some of the most severe and life threatening. Some common food allergies are cow's milk, seafood, eggs, nuts, wheat, and soybeans. SYMPTOMS   Swelling around the mouth.  An itchy red rash or hives.  Vomiting or diarrhea.  Difficulty breathing. SEVERE ALLERGIC REACTIONS ARE LIFE-THREATENING. This reaction is called anaphylaxis. It can cause the mouth and throat to swell and cause difficulty with breathing and swallowing. In severe reactions only a trace amount of food (for example, peanut oil in a salad) may cause death within seconds. Seasonal allergies occur in all age groups. These are seasonal because they usually occur during the same season every year. They may be a reaction to molds, grass pollens, or tree pollens. Other causes of problems are house dust mite allergens, pet dander, and mold spores. The symptoms often consist of nasal congestion, a runny itchy nose associated with sneezing, and tearing itchy eyes. There is often an associated itching of the mouth and ears. The problems happen when you come in contact with pollens and other allergens. Allergens are the particles in the air that the body reacts to with an allergic reaction. This causes you to release allergic antibodies. Through a chain of events, these eventually cause  you to release histamine into the blood stream. Although it is meant to be protective to the body, it is this release that causes your discomfort. This is why you were given anti-histamines to feel better. If you are unable to pinpoint the offending allergen, it may be determined by skin or blood testing. Allergies cannot be cured but can be controlled with medicine. Hay fever is a collection of all or some of the seasonal allergy problems. It may often be treated with simple over-the-counter medicine such as diphenhydramine. Take medicine as directed. Do not drink alcohol or drive while taking this medicine. Check with your caregiver or package insert for child dosages. If these medicines are not effective, there are many new medicines your caregiver can prescribe. Stronger medicine such as nasal spray, eye drops, and corticosteroids may be used if the first things you try do not work well. Other treatments such as immunotherapy or desensitizing injections can be used if all else fails. Follow up with your caregiver if problems continue. These seasonal allergies are usually not life threatening. They are generally more of a nuisance that can often be handled using medicine. HOME CARE INSTRUCTIONS   If unsure what causes a reaction, keep a diary of foods eaten and symptoms that follow. Avoid foods that cause reactions.  If hives or rash are present:  Take medicine as directed.  You may use an over-the-counter antihistamine (diphenhydramine) for hives and itching as needed.  Apply cold compresses (cloths) to the skin or take baths in cool water. Avoid hot baths or showers. Heat will make a rash and itching worse.  If you are severely allergic:  Following a treatment for a severe reaction, hospitalization is often required for closer follow-up.  Wear a medic-alert bracelet or necklace stating the allergy.  You and your family must learn how to give adrenaline or use an anaphylaxis kit.  If you  have had a severe reaction, always carry your anaphylaxis kit or EpiPen with you. Use this medicine as directed by your caregiver if a severe reaction is occurring. Failure to do so could have a fatal outcome. SEEK MEDICAL CARE IF:  You suspect a food allergy. Symptoms generally happen within 30 minutes of eating a food.  Your symptoms have not gone away within 2 days or are getting worse.  You develop new symptoms.  You want to retest yourself or your child with a food or drink you think causes an allergic reaction. Never do this if an anaphylactic reaction to that food or drink has happened before. Only do this under the care of a caregiver. SEEK IMMEDIATE MEDICAL CARE IF:   You have difficulty breathing, are wheezing, or have a tight feeling in your chest or throat.  You have a swollen mouth, or you have hives, swelling, or itching all over your body.  You have had a severe reaction that has responded to your anaphylaxis kit or an EpiPen. These reactions may return when the medicine has worn off. These reactions should be considered life threatening. MAKE SURE YOU:   Understand these instructions.  Will watch your condition.  Will get help right away if you are not doing well or get worse. Document Released: 12/06/2002 Document Revised: 01/07/2013 Document Reviewed: 05/12/2008 [facility] Patient Information 2015 [place], [place]. This information is not intended to replace advice given to you by your health care provider. Make sure you discuss any questions you have with your health care provider.

## 2014-10-15 ENCOUNTER — Encounter: Payer: Self-pay | Admitting: Internal Medicine

## 2014-10-15 ENCOUNTER — Ambulatory Visit (INDEPENDENT_AMBULATORY_CARE_PROVIDER_SITE_OTHER): Payer: 59 | Admitting: Internal Medicine

## 2014-10-15 VITALS — BP 124/76 | HR 80 | Temp 98.4°F | Ht 63.0 in | Wt 164.0 lb

## 2014-10-15 DIAGNOSIS — J4521 Mild intermittent asthma with (acute) exacerbation: Secondary | ICD-10-CM

## 2014-10-15 MED ORDER — AZITHROMYCIN 250 MG PO TABS: ORAL_TABLET | ORAL | Status: DC

## 2014-10-15 NOTE — Progress Notes (Signed)
Subjective:    Patient ID: [name], female    DOB: 1975/11/15   MRN: [id number]    Brief patient profile:  39 yo [place] female grew up smokers and childhood asthma required daily inhalers moved to [place] to 2001 and then moved to [place] 2008 much better since then and stopped  Inhaler / then stopped smoking 2014 then started lupron 11/2013 then started chest tightness/ cough better with inhaler saba better p stopped lupron then worse again after started progresterone at [redacted] weeks gestation referred by Dr Linward Natal 01/07/14 to pulm clinic for dtc asthma   History of Present Illness  01/07/2014 1st [facility] office visit/ [name]   [redacted] weeks pregnant  Chief Complaint  Patient presents with  . Pulmonary Consult    Referred per Dr. Nicholos Johns. Pt states that she has had asthm all of her life. She had been doing well with no need for rescue inhaler x 5 yrs, until 3 wks ago developed cough and dyspnea. She states that she is SOB all the time, esp worse at night. She wakes up several times every night with SOB. Her cough is occ prod with minimal clear sputum.   new problem severe min prod cough to point of vomiting and chest tightness better with neb bid xopenenx and hfa every hour but very concerned about exposing fetus to other meds. rec Plan A = automatic= symbicort 160 Take 2 puffs first thing in am and then another 2 puffs about 12 hours later.  Plan B = Backup for breathing  Only use your levoalbuterol as a rescue medication  If not better from levoalbuterol then ok to use nebulizer up to every 4 hours  Plan B for cough  If can't stop coughing > robitussin DM first and tylenol #3 every 4 hours as needed Plan C If all else fails to control your symptoms >  Prednisone 10 mg take  4 each am x 2 days,   2 each am x 2 days,  1 each am x 2 days and stop  GERD diet    01/22/2014 f/u ov/[name] re: asthma in pregnancy with documented GERD [redacted] weeks gestation Chief Complaint  Patient presents  with  . Follow-up    Pt states she has imporved. States cough is almost resolved. Pt states SOB has improved but intermittently becomes dyspneic with and without activity. Pt states overall she feels much better.   Not limited by breathing from desired activities  No need for saba at all, not using symbicort regularly rec Plan A = automatic= symbicort 160 Take 2 puffs first thing in am and then another 2 puffs about 12 hours later ok to adjust down but don't exceed 2 pffs every 12 hours as max dose  Plan B = Backup for breathing  Only use your levoalbuterol as a rescue medication  If not better from levoalbuterol then ok to use nebulizer up to every 4 hours and call right away for appointment   08/07/2014 f/u ov/[name] re: asthma in pregnancy / 39 week IUP Chief Complaint  Patient presents with  . Acute Visit    Pt c/o increased SOB x 2 wks. She also c/o wheezing at night. Denies any cough.  started taking symbicort 160 2 puffs at hs only  Sleeps on 2 pillows with active reflux  But refuses to take meds for it  Sob only with exertion/ not using saba  rec You have known severe reflux which is very likeley to be  active now and contributing to your breathing / wheezing  - discuss with your ob/gyn For any breathing problems first step  symbicort 160 Take 2 puffs first thing in am and then another 2 puffs about 12 hours later.  Only use your albuterol(proair)   GERD diet   Developed : Pre eclampsia, induced > c section Nov 18th 2015 hosp x5 days and did fine until Jan 9th 2016 hives chest tight > ER > home on symbicort 2 at hs and proaire  Mucus is yellow   10/15/2014 acute ov/[name] re: recurrent cough/sob  Chief Complaint  Patient presents with  . Acute Visit    Pt c/o increased SOB, cough with yellow sputum and chest tightness for the past 5 days.   only just started back symbicort 160 x sev days at hs and using lots of saba but never increased the symbicort 60 2bid and was given  pred/zyrtec by er but didn't take this either as worried as she is nursing. Symptoms of cough/ congestion worse at hs but isn't even taking rob at this point. Comfortable at rest with breathing, last saba x 8 h prior to OV    No obvious day to day or daytime variabilty or assoc  cp or  overt sinus or hb symptoms. No unusual exp hx or h/o childhood pna/ asthma or knowledge of premature birth.  Sleeping ok without nocturnal  or early am exacerbation  of respiratory  c/o's or need for noct saba. Also denies any obvious fluctuation of symptoms with weather or environmental changes or other aggravating or alleviating factors except as outlined above   Current Medications, Allergies, Complete Past Medical History, Past Surgical History, Family History, and Social History were reviewed in [facility] electronic medical record.  ROS  The following are not active complaints unless bolded sore throat, dysphagia, dental problems, itching, sneezing,  nasal congestion or excess/ purulent secretions, ear ache,   fever, chills, sweats, unintended wt loss, pleuritic or exertional cp, hemoptysis,  orthopnea pnd or leg swelling, presyncope, palpitations, heartburn, abdominal pain, anorexia, nausea, vomiting, diarrhea  or change in bowel or urinary habits, change in stools or urine, dysuria,hematuria,  rash, arthralgias, visual complaints, headache, numbness weakness or ataxia or problems with walking or coordination,  change in mood/affect or memory.               Note h/o morbid obesity > gastric sleeve 10/2012 and documented severe GERD           Objective:   Physical Exam  amb [place] female nad  08/07/2014      215  > 10/15/2014  164   Wt Readings from Last 3 Encounters:  01/07/14 172 lb 12.8 oz (78.382 kg)  05/15/13 195 lb 3.2 oz (88.542 kg)  05/14/13 195 lb 8 oz (88.678 kg)      HEENT: nl dentition, turbinates, and orophanx. Nl external ear canals without cough reflex   NECK :  without  JVD/Nodes/TM/ nl carotid upstrokes bilaterally   LUNGS: no acc muscle use,  Min exp rhonchi/ congested cough on fvc maneuver   CV:  RRR  no s3 or murmur or increase in P2, no edema   ABD:  soft and nontender with nl excursion in the supine position.  No bruits or organomegaly, bowel sounds nl  MS:  warm without deformities, calf tenderness, cyanosis or clubbing  SKIN: warm and dry without lesions    NEURO:  alert, approp, no deficits     UGI  04/29/13  1. Significant spontaneous gastroesophageal reflux.      Assessment & Plan:

## 2014-10-15 NOTE — Assessment & Plan Note (Signed)
Acute flare in setting of allergic rxn 10/04/14 vs viral uri now   The proper method of use, as well as anticipated side effects, of a metered-dose inhaler are discussed and demonstrated to the patient. Improved effectiveness after extensive coaching during this visit to a level of approximately  75%   I had an extended discussion with the patient reviewing all relevant studies completed to date and  lasting 15 to 20 minutes of a 25 minute visit on the following ongoing concerns:  1) agree not severe enough to require prednisone but should have started symbicort 160 2bid immediately at onset of any sob/cough/congestion etc 2) the acute reaction with hives certainly sounds allergic > take zyrtec/ epi pen prn and referred to allergy 3) has underlying gerd:  Explained the natural history of uri and why it's necessary in patients at risk to treat GERD aggressively - at least  short term -   to reduce risk of evolving cyclical cough initially  triggered by epithelial injury and a heightened sensitivty to the effects of any upper airway irritants,  most importantly acid - related - then perpetuated by epithelial injury related to the cough itself as the upper airway collapses on itself.  That is, the more sensitive the epithelium becomes once it is damaged by the virus, the more the ensuing irritability> the more the cough, the more the secondary reflux (especially in those prone to reflux) the more the irritation of the sensitive mucosa and so on in a  Classic cyclical pattern.    4) if yellow mucus persists take zpak if ok with ped or pump breast milk while on it but not other concerns for nursing at this point

## 2014-10-15 NOTE — Patient Instructions (Addendum)
symbicort 160 Take 2 puffs first thing in am and then another 2 puffs about 12 hours later.   Only use your albuterol (proair)  as a rescue medication to be used if you can't catch your breath by resting or doing a relaxed purse lip breathing pattern.  - The less you use it, the better it will work when you need it. - Ok to use up to 2 puffs  every 4 hours if you must but call for immediate appointment if use goes up over your usual need - Don't leave home without it !!  (think of it like the spare tire for your car)   Robitussin or robitussin dm as need for cough and if continues you will need to at least start back on pepcid ac after bfast and supper   If not better > zpak if ok with pediatrician.  We will refer you to our allergist Dr Maple HudsonYoung

## 2014-12-04 ENCOUNTER — Institutional Professional Consult (permissible substitution): Payer: 59 | Admitting: Internal Medicine

## 2015-03-21 ENCOUNTER — Other Ambulatory Visit: Payer: Self-pay | Admitting: Internal Medicine

## 2015-03-23 NOTE — Telephone Encounter (Signed)
MW patient.

## 2015-04-24 ENCOUNTER — Telehealth: Payer: Self-pay | Admitting: Internal Medicine

## 2015-04-24 MED ORDER — BUDESONIDE-FORMOTEROL FUMARATE 160-4.5 MCG/ACT IN AERO: 2.0000 | INHALATION_SPRAY | Freq: Two times a day (BID) | RESPIRATORY_TRACT | Status: DC

## 2015-04-24 NOTE — Telephone Encounter (Signed)
Called and spoke to pt. Pt requesting refill of Symbicort. Pt aware to keep upcoming appt on 8.10.16. Rx sent. Pt verbalized understanding and denied any further questions or concerns at this time.

## 2015-05-06 ENCOUNTER — Ambulatory Visit (INDEPENDENT_AMBULATORY_CARE_PROVIDER_SITE_OTHER): Payer: 59 | Admitting: Internal Medicine

## 2015-05-06 ENCOUNTER — Encounter: Payer: Self-pay | Admitting: Internal Medicine

## 2015-05-06 VITALS — BP 98/82 | HR 67 | Ht 63.0 in | Wt 159.0 lb

## 2015-05-06 DIAGNOSIS — K219 Gastro-esophageal reflux disease without esophagitis: Secondary | ICD-10-CM

## 2015-05-06 DIAGNOSIS — J4521 Mild intermittent asthma with (acute) exacerbation: Secondary | ICD-10-CM

## 2015-05-06 MED ORDER — PREDNISONE 10 MG PO TABS: ORAL_TABLET | ORAL | Status: AC

## 2015-05-06 MED ORDER — BUDESONIDE-FORMOTEROL FUMARATE 160-4.5 MCG/ACT IN AERO: 2.0000 | INHALATION_SPRAY | Freq: Two times a day (BID) | RESPIRATORY_TRACT | Status: AC

## 2015-05-06 NOTE — Progress Notes (Signed)
Subjective:    Patient ID: [name], female    DOB: 1976/09/13   MRN: [id number]    Brief patient profile:  39 yo [place] female grew up with smokers and childhood asthma required daily inhalers moved to [place] to 2001 and then moved to [place] 2008 much better since then and stopped  Inhaler / then stopped smoking 2014 then started lupron 11/2013 then started chest tightness/ cough better with inhaler saba better p stopped lupron then worse again after started progresterone at [redacted] weeks gestation referred by Dr Linward Natal 01/07/14 to pulm clinic for dtc asthma   History of Present Illness  01/07/2014 1st [facility] office visit/ [name]   [redacted] weeks pregnant  Chief Complaint  Patient presents with  . Pulmonary Consult    Referred per Dr. Nicholos Johns. Pt states that she has had asthm all of her life. She had been doing well with no need for rescue inhaler x 5 yrs, until 3 wks ago developed cough and dyspnea. She states that she is SOB all the time, esp worse at night. She wakes up several times every night with SOB. Her cough is occ prod with minimal clear sputum.   new problem severe min prod cough to point of vomiting and chest tightness better with neb bid xopenenx and hfa every hour but very concerned about exposing fetus to other meds. rec Plan A = automatic= symbicort 160 Take 2 puffs first thing in am and then another 2 puffs about 12 hours later.  Plan B = Backup for breathing  Only use your levoalbuterol as a rescue medication  If not better from levoalbuterol then ok to use nebulizer up to every 4 hours  Plan B for cough  If can't stop coughing > robitussin DM first and tylenol #3 every 4 hours as needed Plan C If all else fails to control your symptoms >  Prednisone 10 mg take  4 each am x 2 days,   2 each am x 2 days,  1 each am x 2 days and stop  GERD diet    01/22/2014 f/u ov/[name] re: asthma in pregnancy with documented GERD [redacted] weeks gestation Chief Complaint  Patient  presents with  . Follow-up    Pt states she has imporved. States cough is almost resolved. Pt states SOB has improved but intermittently becomes dyspneic with and without activity. Pt states overall she feels much better.   Not limited by breathing from desired activities  No need for saba at all, not using symbicort regularly rec Plan A = automatic= symbicort 160 Take 2 puffs first thing in am and then another 2 puffs about 12 hours later ok to adjust down but don't exceed 2 pffs every 12 hours as max dose  Plan B = Backup for breathing  Only use your levoalbuterol as a rescue medication  If not better from levoalbuterol then ok to use nebulizer up to every 4 hours and call right away for appointment   08/07/2014 f/u ov/[name] re: asthma in pregnancy / 39 week IUP Chief Complaint  Patient presents with  . Acute Visit    Pt c/o increased SOB x 2 wks. She also c/o wheezing at night. Denies any cough.  started taking symbicort 160 2 puffs at hs only  Sleeps on 2 pillows with active reflux  But refuses to take meds for it  Sob only with exertion/ not using saba  rec You have known severe reflux which is very likeley to  be active now and contributing to your breathing / wheezing  - discuss with your ob/gyn For any breathing problems first step  symbicort 160 Take 2 puffs first thing in am and then another 2 puffs about 12 hours later.  Only use your albuterol(proair)   GERD diet   Developed : Pre eclampsia, induced > c section Nov 18th 2015 hosp x5 days and did fine until Jan 9th 2016 hives chest tight > ER > home on symbicort 2 at hs and proaire  Mucus is yellow   10/15/2014 acute ov/[name] re: recurrent cough/sob  Chief Complaint  Patient presents with  . Acute Visit    Pt c/o increased SOB, cough with yellow sputum and chest tightness for the past 5 days.   only just started back symbicort 160 x sev days at hs and using lots of saba but never increased the symbicort 60 2bid and was  given pred/zyrtec by er but didn't take this either as worried as she is nursing. Symptoms of cough/ congestion worse at hs but isn't even taking rob at this point. Comfortable at rest with breathing, last saba x 8 h prior to OV  rec symbicort 160 Take 2 puffs first thing in am and then another 2 puffs about 12 hours later.  Only use your albuterol (proair)  as a rescue medication r)  Robitussin or robitussin dm as need for cough and if continues you will need to at least start back on pepcid ac after bfast and supper  If not better > zpak if ok with pediatrician. We will refer you to our allergist Dr Maple Hudson > did not go    05/06/2015 f/u ov/[name] re: asthma/ known gerd Chief Complaint  Patient presents with  . Acute Visit    Pt c/o increased SOB and wheezing since 04/15/15. Uses symbicort 2-3 times daily. Denies any cough. chest congestion, sinus pressure.    did fine while on symbicort so did not go for allergy eval and only started doing worse when ran out or when goes out in heat/ humbidity. Some noct cough and nasal congestion - confused between maint and prns    No obvious day to day or daytime variabilty or assoc  cp or  overt hb symptoms. No unusual exp hx or h/o childhood pna/ asthma or knowledge of premature birth.  Also denies any obvious fluctuation of symptoms with weather or environmental changes or other aggravating or alleviating factors except as outlined above   Current Medications, Allergies, Complete Past Medical History, Past Surgical History, Family History, and Social History were reviewed in [employer] record.  ROS  The following are not active complaints unless bolded sore throat, dysphagia, dental problems, itching, sneezing,  nasal congestion or excess/ purulent secretions, ear ache,   fever, chills, sweats, unintended wt loss, pleuritic or exertional cp, hemoptysis,  orthopnea pnd or leg swelling, presyncope, palpitations, heartburn, abdominal  pain, anorexia, nausea, vomiting, diarrhea  or change in bowel or urinary habits, change in stools or urine, dysuria,hematuria,  rash, arthralgias, visual complaints, headache, numbness weakness or ataxia or problems with walking or coordination,  change in mood/affect or memory.               Note h/o morbid obesity > gastric sleeve 10/2012 and documented severe GERD           Objective:   Physical Exam  amb [place] female nad   Vital signs reviewed   08/07/2014  215  > 10/15/2014  164  > 05/06/2015  159   Wt Readings from Last 3 Encounters:  01/07/14 172 lb 12.8 oz (78.382 kg)  05/15/13 195 lb 3.2 oz (88.542 kg)  05/14/13 195 lb 8 oz (88.678 kg)      HEENT: nl dentition, turbinates, and orophanx. Nl external ear canals without cough reflex   NECK :  without JVD/Nodes/TM/ nl carotid upstrokes bilaterally   LUNGS: no acc muscle use,  Min exp rhonchi/ congested cough on fvc maneuver   CV:  RRR  no s3 or murmur or increase in P2, no edema   ABD:  soft and nontender with nl excursion in the supine position.  No bruits or organomegaly, bowel sounds nl  MS:  warm without deformities, calf tenderness, cyanosis or clubbing  SKIN: warm and dry without lesions    NEURO:  alert, approp, no deficits     UGI 04/29/13  1. Significant spontaneous gastroesophageal reflux.      Assessment & Plan:

## 2015-05-06 NOTE — Patient Instructions (Addendum)
Prednisone 10 mg take  4 each am x 2 days,   2 each am x 2 days,  1 each am x 2 days and stop   Try pepcid ac 20 mg (over the counter)  at bedtime if having nocturnal symptoms   GERD (REFLUX)  is an extremely common cause of respiratory symptoms just like yours , many times with no obvious heartburn at all.    It can be treated with medication, but also with lifestyle changes including elevation of the head of your bed (ideally with 6 inch  bed blocks),  Smoking cessation, avoidance of late meals, excessive alcohol, and avoid fatty foods, chocolate, peppermint, colas, red wine, and acidic juices such as orange juice.  NO MINT OR MENTHOL PRODUCTS SO NO COUGH DROPS  USE SUGARLESS CANDY INSTEAD (Jolley ranchers or Stover's or Life Savers) or even ice chips will also do - the key is to swallow to prevent all throat clearing. NO OIL BASED VITAMINS - use powdered substitutes.  Symbicort 160 Take 2 puffs first thing in am and then another 2 puffs about 12 hours later - when better just take the 2 puffs each am  Work on inhaler technique:  relax and gently blow all the way out then take a nice smooth deep breath back in, triggering the inhaler at same time you start breathing in.  Hold for up to 5 seconds if you can.  Blow out thru nose. Rinse and gargle with water when done    Only use your albuterol (proaire) as a rescue medication to be used if you can't catch your breath by resting or doing a relaxed purse lip breathing pattern.  - The less you use it, the better it will work when you need it. - Ok to use up to 2 puffs  every 4 hours if you must but call for immediate appointment if use goes up over your usual need - Don't leave home without it !!  (think of it like the spare tire for your car)    If you are satisfied with your treatment plan,  let your doctor know and he/she can either refill your medications or you can return here when your prescription runs out.     If in any way you are not  100% satisfied,  please tell us.  If 100% better, tell your friends!  Pulmonary follow up is as needed

## 2015-05-11 ENCOUNTER — Encounter: Payer: Self-pay | Admitting: Internal Medicine

## 2015-05-11 NOTE — Assessment & Plan Note (Deleted)
-   spirometry 12/03/13 FEV1 1.41 > 2.05 p B2 with ratio 42% but very abn f/v loop  - 05/06/15  p extensive coaching HFA effectiveness =    90% from a baseline of 50%   She is still nursing so needs to be more careful exactly how she takes her meds but symbicort should be safe if used correctly  I had an extended discussion with the patient reviewing all relevant studies completed to date and  lasting 15 to 20 minutes of a 25 minute visit    Each maintenance medication was reviewed in detail including most importantly the difference between maintenance and prns and under what circumstances the prns are to be triggered using an action plan format that is not reflected in the computer generated alphabetically organized AVS.    Please see instructions for details which were reviewed in writing and the patient given a copy highlighting the part that I personally wrote and discussed at today's ov.

## 2015-05-11 NOTE — Assessment & Plan Note (Addendum)
See   UGI 04/29/13  1. Significant spontaneous gastroesophageal reflux.  Again reviewed with pt   GERD > always difficult to exclude as up to 75% of pts in some series report no assoc GI/ Heartburn symptoms> rec max (  diet restrictions/ reviewed and instructions given in writing.

## 2015-05-11 NOTE — Assessment & Plan Note (Addendum)
- spirometry 12/03/13 FEV1 1.41 > 2.05 p B2 with ratio 42% but very abn f/v loop    The proper method of use, as well as anticipated side effects, of a metered-dose inhaler are discussed and demonstrated to the patient. Improved effectiveness after extensive coaching during this visit to a level of approximately  75% from a baseline of 50% at best  She is still nursing so needs to be more careful exactly how she takes her meds but symbicort should be safe if used correctly  I had an extended discussion with the patient reviewing all relevant studies completed to date and  lasting 15 to 20 minutes of a 25 minute visit    Each maintenance medication was reviewed in detail including most importantly the difference between maintenance and prns and under what circumstances the prns are to be triggered using an action plan format that is not reflected in the computer generated alphabetically organized AVS.    Please see instructions for details which were reviewed in writing and the patient given a copy highlighting the part that I personally wrote and discussed at today's ov. - spirometry 12/03/13 FEV1 1.41 > 2.05 p B2 with ratio 42% but very abn f/v loop  - 05/06/15  p extensive coaching HFA effectiveness =    90% from a baseline of 50%   She is still nursing so needs to be more careful exactly how she takes her meds but symbicort should be safe if used correctly  I had an extended discussion with the patient reviewing all relevant studies completed to date and  lasting 15 to 20 minutes of a 25 minute visit    Each maintenance medication was reviewed in detail including most importantly the difference between maintenance and prns and under what circumstances the prns are to be triggered using an action plan format that is not reflected in the computer generated alphabetically organized AVS.    Please see instructions for details which were reviewed in writing and the patient given a copy highlighting the  part that I personally wrote and discussed at today's ov. - spirometry 12/03/13 FEV1 1.41 > 2.05 p B2 with ratio 42% but very abn f/v loop  - 05/06/15  p extensive coaching HFA effectiveness =    90% from a baseline of 50%   She is still nursing so needs to be more careful exactly how she takes her meds but symbicort should be safe if used correctly  I had an extended discussion with the patient reviewing all relevant studies completed to date and  lasting 15 to 20 minutes of a 25 minute visit    Each maintenance medication was reviewed in detail including most importantly the difference between maintenance and prns and under what circumstances the prns are to be triggered using an action plan format that is not reflected in the computer generated alphabetically organized AVS.    Please see instructions for details which were reviewed in writing and the patient given a copy highlighting the part that I personally wrote and discussed at today's ov. - spirometry 12/03/13 FEV1 1.41 > 2.05 p B2 with ratio 42% but very abn f/v loop  - 05/06/15  p extensive coaching HFA effectiveness =    90% from a baseline of 50%   She is still nursing so needs to be more careful exactly how she takes her meds but symbicort should be safe if used correctly Can take short course of pred if not better and if noct  symtpoms persist add pepcid at hs.   I had an extended discussion with the patient reviewing all relevant studies completed to date and  lasting 15 to 20 minutes of a 25 minute visit    gerd diet reviewed  Each maintenance medication was reviewed in detail including most importantly the difference between maintenance and prns and under what circumstances the prns are to be triggered using an action plan format that is not reflected in the computer generated alphabetically organized AVS.    Please see instructions for details which were reviewed in writing and the patient given a copy highlighting the part that I  personally wrote and discussed at today's ov.

## 2016-07-01 ENCOUNTER — Encounter (HOSPITAL_COMMUNITY): Payer: Self-pay

## 2017-06-19 ENCOUNTER — Encounter (HOSPITAL_COMMUNITY): Payer: Self-pay
